# Patient Record
Sex: Female | Born: 1990 | Hispanic: Yes | Marital: Single | Smoking: Never smoker
Health system: Southern US, Community
[De-identification: ages and names within clinical notes are randomized; demographics above are authoritative.]

## PROBLEM LIST (undated history)

## (undated) ENCOUNTER — Inpatient Hospital Stay (HOSPITAL_COMMUNITY): Payer: Self-pay

## (undated) DIAGNOSIS — D649 Anemia, unspecified: Secondary | ICD-10-CM

## (undated) HISTORY — PX: NO PAST SURGERIES: SHX2092

---

## 2011-03-27 NOTE — L&D Delivery Note (Signed)
Delivery Note At 8:58 PM a healthy female was delivered via  (Presentation vertex, ROA ).  APGAR 8,9: weight pending .   Placenta status: spontaneous delivery intact  Anesthesia:  none Episiotomy: none Lacerations: small first degree Suture Repair: 3.0 vicryl rapide Est. Blood Loss (mL): 400cc  Mom to postpartum.  Baby to stay with mom.  Nancy Fisher 01/02/2012, 9:17 PM

## 2011-09-19 ENCOUNTER — Other Ambulatory Visit (HOSPITAL_COMMUNITY): Payer: Self-pay | Admitting: Obstetrics and Gynecology

## 2011-09-19 DIAGNOSIS — Z3689 Encounter for other specified antenatal screening: Secondary | ICD-10-CM

## 2011-09-24 ENCOUNTER — Ambulatory Visit (HOSPITAL_COMMUNITY)
Admission: RE | Admit: 2011-09-24 | Discharge: 2011-09-24 | Disposition: A | Discharge status: Home / Self Care | Payer: Medicaid Other | Source: Ambulatory Visit | Attending: Obstetrics and Gynecology | Admitting: Obstetrics and Gynecology

## 2011-09-24 DIAGNOSIS — Z363 Encounter for antenatal screening for malformations: Secondary | ICD-10-CM | POA: Insufficient documentation

## 2011-09-24 DIAGNOSIS — Z3689 Encounter for other specified antenatal screening: Secondary | ICD-10-CM

## 2011-09-24 DIAGNOSIS — O358XX0 Maternal care for other (suspected) fetal abnormality and damage, not applicable or unspecified: Secondary | ICD-10-CM | POA: Insufficient documentation

## 2011-09-24 DIAGNOSIS — Z1389 Encounter for screening for other disorder: Secondary | ICD-10-CM | POA: Insufficient documentation

## 2011-09-24 DIAGNOSIS — O0930 Supervision of pregnancy with insufficient antenatal care, unspecified trimester: Secondary | ICD-10-CM | POA: Insufficient documentation

## 2011-09-28 LAB — OB RESULTS CONSOLE ABO/RH: RH Type: POSITIVE

## 2011-12-16 ENCOUNTER — Inpatient Hospital Stay (HOSPITAL_COMMUNITY)
Admission: AD | Admit: 2011-12-16 | Discharge: 2011-12-16 | Disposition: A | Discharge status: Home / Self Care | Payer: Medicaid Other | Source: Ambulatory Visit | Attending: Obstetrics and Gynecology | Admitting: Obstetrics and Gynecology

## 2011-12-16 ENCOUNTER — Encounter (HOSPITAL_COMMUNITY): Payer: Self-pay

## 2011-12-16 DIAGNOSIS — O479 False labor, unspecified: Secondary | ICD-10-CM

## 2011-12-16 DIAGNOSIS — O99891 Other specified diseases and conditions complicating pregnancy: Secondary | ICD-10-CM | POA: Insufficient documentation

## 2011-12-16 LAB — POCT FERN TEST

## 2011-12-16 LAB — WET PREP, GENITAL

## 2011-12-16 NOTE — Progress Notes (Signed)
Dr Jackelyn Knife notified of patient complaints of possible leaking amniotic fluids since 0130am, tracing, ctx pattern, sve result, wet prep result and negative fern and pooling per marie williams cnm speculum exam. Order to discharge patient home and call into her choice pharmacy flagyl 500mg  po bid for 7 days, no refills.

## 2011-12-16 NOTE — Progress Notes (Signed)
FHT from early this am reviewed.  Reactive NST, irreg ctx.

## 2011-12-16 NOTE — MAU Note (Signed)
Patient is in with c/o of possible leaking fluids since 0130am. She states that her underpants felt wet. Reports good fetal movement. Denies pain.

## 2011-12-16 NOTE — MAU Provider Note (Signed)
  History     CSN: [id number]  Arrival date and time: 12/16/11 0231   First Provider Initiated Contact with Patient 12/16/11 [phone]      Chief Complaint  Patient presents with  . Rupture of Membranes   HPI Asked to rule out rupture on this patient who denies contractions or bleeding.   OB History    Grav Para Term Preterm Abortions TAB SAB Ect Mult Living   2         1      History reviewed. No pertinent past medical history.  History reviewed. No pertinent past surgical history.  History reviewed. No pertinent family history.  History  Substance Use Topics  . Smoking status: Never Smoker   . Smokeless tobacco: Not on file  . Alcohol Use: No    Allergies: No Known Allergies  No prescriptions prior to admission    ROS See HPI  Physical Exam   Blood pressure 142/84, pulse 88, temperature 98.5 F (36.9 C), temperature source Oral, resp. rate 18, height 5\' 2"  (1.575 m), weight 290 lb 3.2 oz (131.634 kg).  Physical Exam  Constitutional: She is oriented to person, place, and time. She appears well-developed and well-nourished. No distress.  Cardiovascular: Normal rate.   Respiratory: Effort normal.  GI: Soft. She exhibits no distension. There is no tenderness.  Genitourinary: Uterus normal. Vaginal discharge (thin white, no pooing, no ferning) found.  Musculoskeletal: Normal range of motion.  Neurological: She is alert and oriented to person, place, and time.  Skin: Skin is warm and dry.  Psychiatric: She has a normal mood and affect.   Dilation: 1 Effacement (%): 50 Cervical Position: Posterior Station: -3 Presentation: Vertex Exam by:: Peace, rn  MAU Course  Procedures  Assessment and Plan  A:  SIUP at [redacted]w[redacted]d       No evidence of ruptured membranes P:  RN will call Doctor Meisinger  [facility] 12/16/2011, 3:27 AM

## 2012-01-02 ENCOUNTER — Encounter (HOSPITAL_COMMUNITY): Payer: Self-pay | Admitting: *Deleted

## 2012-01-02 ENCOUNTER — Inpatient Hospital Stay (HOSPITAL_COMMUNITY)
Admission: AD | Admit: 2012-01-02 | Discharge: 2012-01-02 | Disposition: A | Discharge status: Home / Self Care | Payer: Medicaid Other | Source: Ambulatory Visit | Attending: Obstetrics and Gynecology | Admitting: Obstetrics and Gynecology

## 2012-01-02 ENCOUNTER — Inpatient Hospital Stay (HOSPITAL_COMMUNITY)
Admission: AD | Admit: 2012-01-02 | Discharge: 2012-01-04 | DRG: 775 | Disposition: A | Discharge status: Home / Self Care | Payer: Medicaid Other | Source: Ambulatory Visit | Attending: Obstetrics and Gynecology | Admitting: Obstetrics and Gynecology

## 2012-01-02 ENCOUNTER — Encounter (HOSPITAL_COMMUNITY): Payer: Self-pay

## 2012-01-02 DIAGNOSIS — Z22330 Carrier of Group B streptococcus: Secondary | ICD-10-CM

## 2012-01-02 DIAGNOSIS — O479 False labor, unspecified: Secondary | ICD-10-CM | POA: Insufficient documentation

## 2012-01-02 DIAGNOSIS — O99892 Other specified diseases and conditions complicating childbirth: Secondary | ICD-10-CM | POA: Diagnosis present

## 2012-01-02 HISTORY — DX: Anemia, unspecified: D64.9

## 2012-01-02 LAB — COMPREHENSIVE METABOLIC PANEL
BUN: 9 mg/dL (ref 6–23)
Calcium: 8.6 mg/dL (ref 8.4–10.5)
GFR calc Af Amer: >90 mL/min (ref >90)
Glucose, Bld: 96 mg/dL (ref 70–99)
Sodium: 133 mEq/L — ABNORMAL LOW (ref 135–145)
Total Protein: 6.1 g/dL (ref 6.0–8.3)

## 2012-01-02 LAB — CBC
HCT: 32.3 % — ABNORMAL LOW (ref 36.0–46.0)
Hemoglobin: 10.5 g/dL — ABNORMAL LOW (ref 12.0–15.0)
MCH: 27.5 pg (ref 26.0–34.0)
MCHC: 32.5 g/dL (ref 30.0–36.0)

## 2012-01-02 LAB — URINE MICROSCOPIC-ADD ON

## 2012-01-02 LAB — URINALYSIS, ROUTINE W REFLEX MICROSCOPIC
Glucose, UA: NEGATIVE mg/dL
Ketones, ur: NEGATIVE mg/dL
Leukocytes, UA: NEGATIVE
Nitrite: NEGATIVE
Protein, ur: NEGATIVE mg/dL
Urobilinogen, UA: 0.2 mg/dL (ref 0.0–1.0)

## 2012-01-02 MED ORDER — MEASLES, MUMPS & RUBELLA VAC ~~LOC~~ INJ
0.5000 mL | INJECTION | Freq: Once | SUBCUTANEOUS | Status: AC
  Administered 2012-01-04: 0.5 mL via SUBCUTANEOUS
  Filled 2012-01-02: qty 0.5

## 2012-01-02 MED ORDER — OXYCODONE-ACETAMINOPHEN 5-325 MG PO TABS
1.0000 | ORAL_TABLET | ORAL | Status: DC | PRN
  Administered 2012-01-03 – 2012-01-04 (×4): 1 via ORAL
  Filled 2012-01-02 (×4): qty 1

## 2012-01-02 MED ORDER — OXYTOCIN 40 UNITS IN LACTATED RINGERS INFUSION - SIMPLE MED
62.5000 mL/h | Freq: Once | INTRAVENOUS | Status: DC
  Filled 2012-01-02: qty 1000

## 2012-01-02 MED ORDER — IBUPROFEN 600 MG PO TABS
600.0000 mg | ORAL_TABLET | Freq: Four times a day (QID) | ORAL | Status: DC
  Administered 2012-01-03 – 2012-01-04 (×8): 600 mg via ORAL
  Filled 2012-01-02 (×8): qty 1

## 2012-01-02 MED ORDER — OXYCODONE-ACETAMINOPHEN 5-325 MG PO TABS: 1.0000 | ORAL_TABLET | ORAL | Status: DC | PRN

## 2012-01-02 MED ORDER — ONDANSETRON HCL 4 MG/2ML IJ SOLN: 4.0000 mg | Freq: Four times a day (QID) | INTRAMUSCULAR | Status: DC | PRN

## 2012-01-02 MED ORDER — CITRIC ACID-SODIUM CITRATE 334-500 MG/5ML PO SOLN: 30.0000 mL | ORAL | Status: DC | PRN

## 2012-01-02 MED ORDER — SODIUM CHLORIDE 0.9 % IV SOLN
2.0000 g | Freq: Once | INTRAVENOUS | Status: AC
  Administered 2012-01-02: 2 g via INTRAVENOUS
  Filled 2012-01-02: qty 2000

## 2012-01-02 MED ORDER — WITCH HAZEL-GLYCERIN EX PADS: 1.0000 "application " | MEDICATED_PAD | CUTANEOUS | Status: DC | PRN

## 2012-01-02 MED ORDER — BUTORPHANOL TARTRATE 1 MG/ML IJ SOLN
1.0000 mg | INTRAMUSCULAR | Status: DC | PRN
  Administered 2012-01-02: 1 mg via INTRAVENOUS
  Filled 2012-01-02: qty 1

## 2012-01-02 MED ORDER — DIPHENHYDRAMINE HCL 50 MG/ML IJ SOLN: 12.5000 mg | INTRAMUSCULAR | Status: DC | PRN

## 2012-01-02 MED ORDER — ACETAMINOPHEN 325 MG PO TABS: 650.0000 mg | ORAL_TABLET | ORAL | Status: DC | PRN

## 2012-01-02 MED ORDER — LACTATED RINGERS IV SOLN: 500.0000 mL | Freq: Once | INTRAVENOUS | Status: DC

## 2012-01-02 MED ORDER — DIBUCAINE 1 % RE OINT: 1.0000 "application " | TOPICAL_OINTMENT | RECTAL | Status: DC | PRN

## 2012-01-02 MED ORDER — EPHEDRINE 5 MG/ML INJ: 10.0000 mg | INTRAVENOUS | Status: DC | PRN

## 2012-01-02 MED ORDER — BENZOCAINE-MENTHOL 20-0.5 % EX AERO
1.0000 "application " | INHALATION_SPRAY | CUTANEOUS | Status: DC | PRN
  Administered 2012-01-03: 1 via TOPICAL
  Filled 2012-01-02: qty 56

## 2012-01-02 MED ORDER — LACTATED RINGERS IV SOLN: 500.0000 mL | INTRAVENOUS | Status: DC | PRN

## 2012-01-02 MED ORDER — ONDANSETRON HCL 4 MG/2ML IJ SOLN: 4.0000 mg | INTRAMUSCULAR | Status: DC | PRN

## 2012-01-02 MED ORDER — PHENYLEPHRINE 40 MCG/ML (10ML) SYRINGE FOR IV PUSH (FOR BLOOD PRESSURE SUPPORT): 80.0000 ug | PREFILLED_SYRINGE | INTRAVENOUS | Status: DC | PRN

## 2012-01-02 MED ORDER — IBUPROFEN 600 MG PO TABS: 600.0000 mg | ORAL_TABLET | Freq: Four times a day (QID) | ORAL | Status: DC | PRN

## 2012-01-02 MED ORDER — ZOLPIDEM TARTRATE 5 MG PO TABS: 5.0000 mg | ORAL_TABLET | Freq: Every evening | ORAL | Status: DC | PRN

## 2012-01-02 MED ORDER — OXYTOCIN 40 UNITS IN LACTATED RINGERS INFUSION - SIMPLE MED
1.0000 m[IU]/min | INTRAVENOUS | Status: DC
  Administered 2012-01-02: 2 m[IU]/min via INTRAVENOUS

## 2012-01-02 MED ORDER — LACTATED RINGERS IV SOLN
INTRAVENOUS | Status: DC
  Administered 2012-01-02: 19:00:00 via INTRAVENOUS

## 2012-01-02 MED ORDER — FENTANYL 2.5 MCG/ML BUPIVACAINE 1/10 % EPIDURAL INFUSION (WH - ANES): 14.0000 mL/h | INTRAMUSCULAR | Status: DC

## 2012-01-02 MED ORDER — LIDOCAINE HCL (PF) 1 % IJ SOLN
30.0000 mL | INTRAMUSCULAR | Status: DC | PRN
  Administered 2012-01-02: 30 mL via SUBCUTANEOUS
  Filled 2012-01-02: qty 30

## 2012-01-02 MED ORDER — TETANUS-DIPHTH-ACELL PERTUSSIS 5-2.5-18.5 LF-MCG/0.5 IM SUSP: 0.5000 mL | Freq: Once | INTRAMUSCULAR | Status: DC

## 2012-01-02 MED ORDER — SIMETHICONE 80 MG PO CHEW: 80.0000 mg | CHEWABLE_TABLET | ORAL | Status: DC | PRN

## 2012-01-02 MED ORDER — LANOLIN HYDROUS EX OINT: TOPICAL_OINTMENT | CUTANEOUS | Status: DC | PRN

## 2012-01-02 MED ORDER — OXYTOCIN BOLUS FROM INFUSION
500.0000 mL | Freq: Once | INTRAVENOUS | Status: AC
  Administered 2012-01-02: 500 mL via INTRAVENOUS
  Filled 2012-01-02: qty 500

## 2012-01-02 MED ORDER — DIPHENHYDRAMINE HCL 25 MG PO CAPS: 25.0000 mg | ORAL_CAPSULE | Freq: Four times a day (QID) | ORAL | Status: DC | PRN

## 2012-01-02 MED ORDER — ONDANSETRON HCL 4 MG PO TABS: 4.0000 mg | ORAL_TABLET | ORAL | Status: DC | PRN

## 2012-01-02 MED ORDER — TERBUTALINE SULFATE 1 MG/ML IJ SOLN: 0.2500 mg | Freq: Once | INTRAMUSCULAR | Status: DC | PRN

## 2012-01-02 MED ORDER — SENNOSIDES-DOCUSATE SODIUM 8.6-50 MG PO TABS
2.0000 | ORAL_TABLET | Freq: Every day | ORAL | Status: DC
  Administered 2012-01-03: 2 via ORAL

## 2012-01-02 MED ORDER — PRENATAL MULTIVITAMIN CH
1.0000 | ORAL_TABLET | Freq: Every day | ORAL | Status: DC
  Administered 2012-01-03 – 2012-01-04 (×2): 1 via ORAL
  Filled 2012-01-02 (×2): qty 1

## 2012-01-02 NOTE — MAU Note (Signed)
C/o ucs since 0325 this Am;

## 2012-01-02 NOTE — MAU Note (Signed)
Dr. Senaida Ores called to check on pt, notified of pt's cervical exam, unchanged from yesterday, efm tracing reactive. Orders to d/c home with labor precautions.

## 2012-01-02 NOTE — H&P (Signed)
Nancy Fisher is a 21 y.o. female G2P1001 at 39+weeks (EDD 01/06/12 by 25 week [place]) presenting for painful contractions and cervical change to 5cm.  Prenatal care complicated by late start at 25 weeks.  SHe is rubella equivocal and GBS positive. No other issues.  Maternal Medical History:  Reason for admission: Reason for admission: contractions.  Contractions: Onset was 6-12 hours ago.   Frequency: regular.   Perceived severity is moderate.    Fetal activity: Perceived fetal activity is normal.    Prenatal complications: No hypertension.     OB History    Grav Para Term Preterm Abortions TAB SAB Ect Mult Living   2 1 1       1     2011 NSVD 7-8lbs  Past Medical History  Diagnosis Date  . Anemia    Past Surgical History  Procedure Date  . No past surgeries    Family History: family history is negative for Other, and Alcohol abuse, and Arthritis, and Asthma, and Birth defects, and Cancer, and COPD, and Depression, and Diabetes, and Drug abuse, and Early death, and Hearing loss, and Heart disease, and Hyperlipidemia, and Hypertension, and Kidney disease, and Learning disabilities, and Mental illness, and Mental retardation, and Miscarriages / Stillbirths, and Stroke, and Vision loss, . Social History:  reports that she has never smoked. She has never used smokeless tobacco. She reports that she does not drink alcohol or use illicit drugs.   Prenatal Transfer Tool  Maternal Diabetes: No Genetic Screening: too late to care Maternal Ultrasounds/Referrals: Normal Fetal Ultrasounds or other Referrals:  None Maternal Substance Abuse:  No Significant Maternal Medications:  None Significant Maternal Lab Results:  Lab values include: Group B Strep positive Other Comments:  None  Review of Systems  Neurological: Negative for headaches.    Dilation: 5 Effacement (%): 90 Station: -2 Exam by:: Ameliarose Shark AROM clear Blood pressure 147/100, pulse 85, temperature 99.1 F (37.3 C),  temperature source Oral, resp. rate 20, height 5' (1.524 m), weight 131.543 kg (290 lb). Maternal Exam:  Uterine Assessment: Contraction strength is moderate.  Contraction frequency is regular.   Abdomen: Patient reports no abdominal tenderness. Fetal presentation: vertex  Introitus: Normal vulva. Normal vagina.    Physical Exam  Constitutional: She is oriented to person, place, and time. She appears well-developed and well-nourished.  Cardiovascular: Normal rate and regular rhythm.   Respiratory: Effort normal and breath sounds normal.  GI: Soft. Bowel sounds are normal.  Genitourinary: Vagina normal and uterus normal.  Neurological: She is alert and oriented to person, place, and time.  Psychiatric: She has a normal mood and affect. Her behavior is normal.    Prenatal labs: ABO, Rh:  O positive Antibody:  negative Rubella: Equivocal (05/22 0000) RPR:   negative HBsAg:   negative HIV:   negative GBS: Positive (09/13 0000)  One hour GCT 93 Assessment/Plan: Pt with some elevated BP but very uncomfortable with contractions.  Will check CBC and CMET.  Urine negative for proteinuria earlier today.   Ampicillin already on board for +GBS.  Pt requests IV pain meds, declines epidural.  Huel Cote W 01/02/2012, 7:56 PM

## 2012-01-02 NOTE — MAU Note (Signed)
Pt states here for labor eval, ctx's q9 minutes apart. Denies lof, notes blood tinged mucus intermittently.

## 2012-01-03 LAB — CBC
HCT: 29.3 % — ABNORMAL LOW (ref 36.0–46.0)
Hemoglobin: 9.4 g/dL — ABNORMAL LOW (ref 12.0–15.0)
MCH: 27.2 pg (ref 26.0–34.0)
MCHC: 32.1 g/dL (ref 30.0–36.0)
MCV: 84.7 fL (ref 78.0–100.0)

## 2012-01-03 LAB — TYPE AND SCREEN
ABO/RH(D): O POS
DAT, IgG: NEGATIVE

## 2012-01-03 NOTE — Progress Notes (Signed)
Post Partum Day 1 Subjective: no complaints and tolerating PO  Objective: Blood pressure 123/88, pulse 90, temperature 98.5 F (36.9 C), temperature source Oral, resp. rate 18, height 5' (1.524 m), weight 131.543 kg (290 lb), unknown if currently breastfeeding.  Physical Exam:  General: alert and cooperative Lochia: appropriate Uterine Fundus: firm   Basename 01/03/12 0515 01/02/12 1955  HGB 9.4* 10.5*  HCT 29.3* 32.3*    Assessment/Plan: Plan for discharge tomorrow   LOS: 1 day   [name] W 01/03/2012, 8:59 AM

## 2012-01-03 NOTE — Plan of Care (Signed)
Problem: Discharge Progression Outcomes Goal: MMR given as ordered Outcome: Not Met (add Reason) MMR prior to d/c

## 2012-01-03 NOTE — Progress Notes (Signed)
UR chart review completed.  

## 2012-01-04 MED ORDER — OXYCODONE-ACETAMINOPHEN 5-325 MG PO TABS: 1.0000 | ORAL_TABLET | ORAL | Status: DC | PRN

## 2012-01-04 MED ORDER — IBUPROFEN 600 MG PO TABS: 600.0000 mg | ORAL_TABLET | Freq: Four times a day (QID) | ORAL | Status: DC

## 2012-01-04 NOTE — Progress Notes (Signed)
Post Partum Day 2  Subjective: no complaints, up ad lib and tolerating PO  Objective: Blood pressure 120/81, pulse 74, temperature 97.4 F (36.3 C), temperature source Oral, resp. rate 18, height 5' (1.524 m), weight 131.543 kg (290 lb), unknown if currently breastfeeding.  Physical Exam:  General: alert and cooperative Lochia: normal Uterine Fundus: firm    Basename 01/03/12 0515 01/02/12 1955  HGB 9.4* 10.5*  HCT 29.3* 32.3*    Assessment/Plan: Discharge home Motrin and percocet   LOS: 2 days   Nancy Fisher W 01/04/2012, 7:53 AM

## 2012-01-04 NOTE — Discharge Summary (Signed)
Obstetric Discharge Summary Reason for Admission: onset of labor Prenatal Procedures: none Intrapartum Procedures: spontaneous vaginal delivery Postpartum Procedures: none Complications-Operative and Postpartum: first degree perineal laceration Hemoglobin  Date Value Range Status  01/03/2012 9.4* 12.0 - 15.0 g/dL Final     HCT  Date Value Range Status  01/03/2012 29.3* 36.0 - 46.0 % Final    Physical Exam:  General: alert and cooperative Lochia: appropriate Uterine Fundus: firm   Discharge Diagnoses: Term Pregnancy-delivered  Discharge Information: Date: 01/04/2012 Activity: pelvic rest Diet: routine Medications: Ibuprofen and Percocet Condition: stable Instructions: refer to practice specific booklet Discharge to: home Follow-up Information    Follow up with Oliver Pila, MD. Schedule an appointment as soon as possible for a visit in 6 weeks.   Contact information:   [address], [address] [place] [address] [phone]          Newborn Data: Live born female  Birth Weight: 6 lb 12.2 oz (3067 g) APGAR: 8, 9  Home with mother.  Oliver Pila 01/04/2012, 7:56 AM

## 2014-01-25 ENCOUNTER — Encounter (HOSPITAL_COMMUNITY): Payer: Self-pay | Admitting: *Deleted

## 2015-11-30 ENCOUNTER — Emergency Department (HOSPITAL_COMMUNITY)
Admission: EM | Admit: 2015-11-30 | Discharge: 2015-11-30 | Disposition: A | Discharge status: Home / Self Care | Payer: Medicaid Other | Attending: Emergency Medicine | Admitting: Emergency Medicine

## 2015-11-30 ENCOUNTER — Encounter (HOSPITAL_COMMUNITY): Payer: Self-pay | Admitting: Emergency Medicine

## 2015-11-30 ENCOUNTER — Emergency Department (HOSPITAL_COMMUNITY): Payer: Medicaid Other

## 2015-11-30 DIAGNOSIS — R0789 Other chest pain: Secondary | ICD-10-CM

## 2015-11-30 LAB — BASIC METABOLIC PANEL
Anion gap: 14 (ref 5–15)
BUN: 16 mg/dL (ref 6–20)
CO2: 24 mmol/L (ref 22–32)
CREATININE: 0.74 mg/dL (ref 0.44–1.00)
Calcium: 8.9 mg/dL (ref 8.9–10.3)
Chloride: 102 mmol/L (ref 101–111)
Glucose, Bld: 102 mg/dL — ABNORMAL HIGH (ref 65–99)
POTASSIUM: 4.1 mmol/L (ref 3.5–5.1)
SODIUM: 140 mmol/L (ref 135–145)

## 2015-11-30 LAB — CBC
HCT: 37.5 % (ref 36.0–46.0)
Hemoglobin: 12.2 g/dL (ref 12.0–15.0)
MCH: 28.2 pg (ref 26.0–34.0)
MCHC: 32.5 g/dL (ref 30.0–36.0)
MCV: 86.6 fL (ref 78.0–100.0)
PLATELETS: 295 10*3/uL (ref 150–400)
RBC: 4.33 MIL/uL (ref 3.87–5.11)
RDW: 13.8 % (ref 11.5–15.5)
WBC: 12.1 10*3/uL — AB (ref 4.0–10.5)

## 2015-11-30 LAB — I-STAT BETA HCG BLOOD, ED (MC, WL, AP ONLY)

## 2015-11-30 LAB — I-STAT TROPONIN, ED
Troponin i, poc: 0.05 ng/mL (ref 0.00–0.08)
Troponin i, poc: 0.02 ng/mL (ref 0.00–0.08)

## 2015-11-30 MED ORDER — IBUPROFEN 800 MG PO TABS
800.0000 mg | ORAL_TABLET | Freq: Once | ORAL | Status: AC
  Administered 2015-11-30: 800 mg via ORAL
  Filled 2015-11-30: qty 1

## 2015-11-30 NOTE — ED Provider Notes (Signed)
TIME SEEN: 4:40 AM  CHIEF COMPLAINT: Chest pain  HPI: Pt is a 25 y.o. female with history of anemia who presents to the emergency department with complaints of chest pain shortness of breath. States she has chest pain throughout her entire chest that feels "like contractions" and states it "pokes me everywhere". Denies that her chest pain is worse with exertion. He states it is worse with lying flat. She does have shortness of breath for the past several days with exertion and has not been able to exercise because of this. No fevers or cough. No nausea, vomiting or diarrhea. No pain or swelling in her legs. No dysuria, hematuria, vaginal bleeding or discharge. States the pain lasts for several seconds and then resolves completely. She also feels like she is having pain in her abdomen as well. No family history of premature CAD. No hypertension, diabetes, hyperlipidemia. She is not a smoker.  No history of PE, DVT, exogenous estrogen use, fracture, surgery, trauma, hospitalization, prolonged travel. No lower extremity swelling or pain. No calf tenderness.   ROS: See HPI Constitutional: no fever  Eyes: no drainage  ENT: no runny nose   Cardiovascular:   chest pain  Resp:  SOB  GI: no vomiting GU: no dysuria Integumentary: no rash  Allergy: no hives  Musculoskeletal: no leg swelling  Neurological: no slurred speech ROS otherwise negative  PAST MEDICAL HISTORY/PAST SURGICAL HISTORY:  Past Medical History:  Diagnosis Date  . Anemia     MEDICATIONS:  Prior to Admission medications   Medication Sig Start Date End Date Taking? Authorizing Provider  ibuprofen (ADVIL,MOTRIN) 600 MG tablet Take 1 tablet (600 mg total) by mouth every 6 (six) hours. 01/04/12   Huel CoteKathy Richardson, MD  oxyCODONE-acetaminophen (PERCOCET/ROXICET) 5-325 MG per tablet Take 1-2 tablets by mouth every 3 (three) hours as needed (moderate - severe pain). 01/04/12   Huel CoteKathy Richardson, MD  Prenatal Vit-Fe Fumarate-FA (PRENATAL  MULTIVITAMIN) TABS Take 1 tablet by mouth daily.    Historical Provider, MD    ALLERGIES:  No Known Allergies  SOCIAL HISTORY:  Social History  Substance Use Topics  . Smoking status: Never Smoker  . Smokeless tobacco: Never Used  . Alcohol use No    FAMILY HISTORY: Family History  Problem Relation Age of Onset  . Other Neg Hx   . Alcohol abuse Neg Hx   . Arthritis Neg Hx   . Asthma Neg Hx   . Birth defects Neg Hx   . Cancer Neg Hx   . COPD Neg Hx   . Depression Neg Hx   . Diabetes Neg Hx   . Drug abuse Neg Hx   . Early death Neg Hx   . Hearing loss Neg Hx   . Heart disease Neg Hx   . Hyperlipidemia Neg Hx   . Hypertension Neg Hx   . Kidney disease Neg Hx   . Learning disabilities Neg Hx   . Mental illness Neg Hx   . Mental retardation Neg Hx   . Miscarriages / Stillbirths Neg Hx   . Stroke Neg Hx   . Vision loss Neg Hx     EXAM: BP 112/78   Pulse 68   Temp 98.4 F (36.9 C) (Oral)   Resp 17   Ht 5\' 3"  (1.6 m)   Wt 150 lb (68 kg)   LMP 11/16/2015 (Approximate)   SpO2 100%   BMI 26.57 kg/m  CONSTITUTIONAL: Alert and oriented and responds appropriately to questions. Well-appearing; well-nourished, appears  anxious HEAD: Normocephalic EYES: Conjunctivae clear, PERRL ENT: normal nose; no rhinorrhea; moist mucous membranes NECK: Supple, no meningismus, no LAD  CARD: RRR; S1 and S2 appreciated; no murmurs, no clicks, no rubs, no gallops RESP: Normal chest excursion without splinting or tachypnea; breath sounds clear and equal bilaterally; no wheezes, no rhonchi, no rales, no hypoxia or respiratory distress, speaking full sentences ABD/GI: Normal bowel sounds; non-distended; soft, non-tender, no rebound, no guarding, no peritoneal signs BACK:  The back appears normal and is non-tender to palpation, there is no CVA tenderness EXT: Normal ROM in all joints; non-tender to palpation; no edema; normal capillary refill; no cyanosis, no calf tenderness or swelling     SKIN: Normal color for age and race; warm; no rash NEURO: Moves all extremities equally, sensation to light touch intact diffusely, cranial nerves II through XII intact PSYCH: The patient's mood and manner are appropriate. Grooming and personal hygiene are appropriate.  MEDICAL DECISION MAKING: Patient here with atypical chest pain. No risk factors for PE. Doubt dissection. Doubt ACS. Troponin negative. Chest x-ray clear. EKG shows no ischemic changes, arrhythmia or interval abnormality.  Pregnancy test is negative. I feel she is safe to be discharged home and follow-up with her outpatient primary care provider.  At this time, I do not feel there is any life-threatening condition present. I have reviewed and discussed all results (EKG, imaging, lab, urine as appropriate), exam findings with patient/family. I have reviewed nursing notes and appropriate previous records.  I feel the patient is safe to be discharged home without further emergent workup and can continue workup as an outpatient as needed. Discussed usual and customary return precautions. Patient/family verbalize understanding and are comfortable with this plan.  Outpatient follow-up has been provided. All questions have been answered.        EKG Interpretation  Date/Time:  Wednesday November 30 2015 01:18:41 EDT Ventricular Rate:  77 PR Interval:  152 QRS Duration: 90 QT Interval:  368 QTC Calculation: 416 R Axis:   86 Text Interpretation:  Normal sinus rhythm Normal ECG No old tracing to compare Confirmed by Correen Bubolz,  DO, Aryana Wonnacott [phone]) on 11/30/2015 4:28:39 AM         Layla Maw Mackinley Cassaday, DO 11/30/15 1914

## 2015-11-30 NOTE — Discharge Instructions (Signed)
To find a primary care or specialty doctor please call [phone] or [phone] to access "[place] [facility] Find a Doctor Service." ° °You may also go on the [facility] website at [email or web address] ° °There are also multiple [facility],  and [facility] practices throughout the [place] that are frequently accepting new patients. You may find a clinic that is close to your home and contact them. ° °[facility] -  °[address] °[place] [address] °[phone] ° °[facility] in [place] (also locations in [place] and [place]) -  °[address] °[place] [address] °[phone] ° °[facility] -  °[address] ° [place] [address] [phone] ° ° °

## 2015-11-30 NOTE — ED Triage Notes (Signed)
Pt. reports left chest pain radiating to left lateral ribs and upper abdomen with SOB and nausea onset last week , pain worsens with exertion or exercising .

## 2015-11-30 NOTE — ED Notes (Signed)
Patient left at this time with all belongings. 

## 2015-11-30 NOTE — ED Notes (Signed)
MD at bedside. 

## 2016-07-21 DIAGNOSIS — G43109 Migraine with aura, not intractable, without status migrainosus: Secondary | ICD-10-CM | POA: Insufficient documentation

## 2017-12-16 DIAGNOSIS — B001 Herpesviral vesicular dermatitis: Secondary | ICD-10-CM | POA: Insufficient documentation

## 2018-03-26 NOTE — L&D Delivery Note (Addendum)
Patient: Nancy Fisher MRN: [id number]  GBS status: Negative   Patient is a 28 y.o. now G3P2002 s/p NSVD at [redacted]w[redacted]d, who was admitted for IOL due to vaginal bleeding. AROM 0h 58m prior to delivery with scant clear fluid.    Delivery Note At 8:08 AM a viable female was delivered via  (Presentation: right OA).  APGAR: 8,9.  weight pending.   Placenta status: Spontaneous, intact.  Cord: 3 vessel, intact with the following complications: loose nuchal cord x1.   Anesthesia: none, lidocaine used for repair.    Episiotomy: None  Lacerations: 1st degree   Suture Repair: 3.0 vicryl Est. Blood Loss (mL): ~550  Head delivered right OA. Loose nuchal cord x1 present. Shoulder and body delivered in usual fashion. Infant with spontaneous cry, placed on mother's abdomen, dried and bulb suctioned. Cord clamped x 2 after 1-minute delay, and cut by family member. Cord blood drawn. Placenta delivered spontaneously with gentle cord traction. Fundus firm with massage and Pitocin. However with continued vaginal bleeding, given cytotec 800mg  rectally. Resolved with additional expulsion of uterine clots. Perineum inspected and found to have a 1st degree laceration, which was repaired by Maryelizabeth Kaufmann, CNM with 3.0 vicryl with good hemostasis achieved.  Mom to postpartum.  Baby to Couplet care / Skin to Skin.  Patriciaann Clan 10/07/2018, 8:31 AM   I was gloved and present for entire delivery SVD without incident No difficulty with shoulders Laceration repair performed by me  Clinic messaged 10/07/18 to coordinate postpartum care  Mallie Snooks, CNM 10/07/18 12:14 PM

## 2018-04-17 LAB — OB RESULTS CONSOLE HEPATITIS B SURFACE ANTIGEN: Hepatitis B Surface Ag: NEGATIVE

## 2018-04-17 LAB — OB RESULTS CONSOLE RUBELLA ANTIBODY, IGM: Rubella: NON-IMMUNE/NOT IMMUNE

## 2018-04-17 LAB — OB RESULTS CONSOLE RPR: RPR: NONREACTIVE

## 2018-04-17 LAB — OB RESULTS CONSOLE HIV ANTIBODY (ROUTINE TESTING): HIV: NONREACTIVE

## 2018-04-17 LAB — OB RESULTS CONSOLE GC/CHLAMYDIA
Chlamydia: NEGATIVE
Gonorrhea: NEGATIVE

## 2018-09-18 LAB — OB RESULTS CONSOLE GBS: GBS: NEGATIVE

## 2018-10-06 ENCOUNTER — Inpatient Hospital Stay (HOSPITAL_COMMUNITY)
Admission: AD | Admit: 2018-10-06 | Discharge: 2018-10-08 | DRG: 807 | Disposition: A | Discharge status: Home / Self Care | Payer: Medicaid Other | Attending: Obstetrics & Gynecology | Admitting: Obstetrics & Gynecology

## 2018-10-06 ENCOUNTER — Other Ambulatory Visit: Payer: Self-pay

## 2018-10-06 ENCOUNTER — Encounter (HOSPITAL_COMMUNITY): Payer: Self-pay | Admitting: *Deleted

## 2018-10-06 DIAGNOSIS — O4693 Antepartum hemorrhage, unspecified, third trimester: Secondary | ICD-10-CM | POA: Diagnosis present

## 2018-10-06 DIAGNOSIS — Z3A39 39 weeks gestation of pregnancy: Secondary | ICD-10-CM

## 2018-10-06 DIAGNOSIS — O26893 Other specified pregnancy related conditions, third trimester: Secondary | ICD-10-CM | POA: Diagnosis present

## 2018-10-06 DIAGNOSIS — Z1159 Encounter for screening for other viral diseases: Secondary | ICD-10-CM

## 2018-10-06 LAB — CBC
HCT: 32.4 % — ABNORMAL LOW (ref 36.0–46.0)
Hemoglobin: 10.1 g/dL — ABNORMAL LOW (ref 12.0–15.0)
MCH: 26 pg (ref 26.0–34.0)
MCHC: 31.2 g/dL (ref 30.0–36.0)
MCV: 83.3 fL (ref 80.0–100.0)
Platelets: 268 10*3/uL (ref 150–400)
RBC: 3.89 MIL/uL (ref 3.87–5.11)
RDW: 19.3 % — ABNORMAL HIGH (ref 11.5–15.5)
WBC: 8.7 10*3/uL (ref 4.0–10.5)
nRBC: 0.5 % — ABNORMAL HIGH (ref 0.0–0.2)

## 2018-10-06 LAB — TYPE AND SCREEN
ABO/RH(D): O POS
Antibody Screen: NEGATIVE

## 2018-10-06 LAB — SARS CORONAVIRUS 2 BY RT PCR (HOSPITAL ORDER, PERFORMED IN ~~LOC~~ HOSPITAL LAB): SARS Coronavirus 2: NEGATIVE

## 2018-10-06 MED ORDER — LIDOCAINE HCL (PF) 1 % IJ SOLN
30.0000 mL | INTRAMUSCULAR | Status: DC | PRN
  Administered 2018-10-07: 30 mL via SUBCUTANEOUS
  Filled 2018-10-06: qty 30

## 2018-10-06 MED ORDER — LACTATED RINGERS IV SOLN: 500.0000 mL | INTRAVENOUS | Status: DC | PRN

## 2018-10-06 MED ORDER — ACETAMINOPHEN 325 MG PO TABS: 650.0000 mg | ORAL_TABLET | ORAL | Status: DC | PRN

## 2018-10-06 MED ORDER — OXYCODONE-ACETAMINOPHEN 5-325 MG PO TABS: 1.0000 | ORAL_TABLET | ORAL | Status: DC | PRN

## 2018-10-06 MED ORDER — OXYTOCIN 40 UNITS IN NORMAL SALINE INFUSION - SIMPLE MED
2.5000 [IU]/h | INTRAVENOUS | Status: DC
  Filled 2018-10-06: qty 1000

## 2018-10-06 MED ORDER — OXYTOCIN BOLUS FROM INFUSION
500.0000 mL | Freq: Once | INTRAVENOUS | Status: AC
  Administered 2018-10-07: 500 mL via INTRAVENOUS

## 2018-10-06 MED ORDER — ONDANSETRON HCL 4 MG/2ML IJ SOLN: 4.0000 mg | Freq: Four times a day (QID) | INTRAMUSCULAR | Status: DC | PRN

## 2018-10-06 MED ORDER — LACTATED RINGERS IV SOLN
INTRAVENOUS | Status: DC
  Administered 2018-10-06: 13:00:00 via INTRAVENOUS

## 2018-10-06 MED ORDER — MISOPROSTOL 50MCG HALF TABLET
ORAL_TABLET | ORAL | Status: AC
  Filled 2018-10-06: qty 1

## 2018-10-06 MED ORDER — OXYCODONE-ACETAMINOPHEN 5-325 MG PO TABS: 2.0000 | ORAL_TABLET | ORAL | Status: DC | PRN

## 2018-10-06 MED ORDER — SOD CITRATE-CITRIC ACID 500-334 MG/5ML PO SOLN: 30.0000 mL | ORAL | Status: DC | PRN

## 2018-10-06 MED ORDER — MISOPROSTOL 50MCG HALF TABLET
50.0000 ug | ORAL_TABLET | ORAL | Status: DC
  Administered 2018-10-06: 23:00:00 50 ug via BUCCAL

## 2018-10-06 NOTE — H&P (Addendum)
LABOR AND DELIVERY ADMISSION HISTORY AND PHYSICAL NOTE  Prudy FeelerCecilia Caldwell is a 28 y.o. female G3P2002 with IUP at 213w1d by LMP presenting for induction of labor in the setting of vaginal bleeding and mild contractions. Patient reports noticing a blood clot this morning when using the restroom. Bleeding has since subsided. Endorses some mild cramping occasionally. No other concerns at this time. She reports positive fetal movement and denies leakage of fluid.  [facility] Pregnancy complications:  - Rubella non-immune   Past Medical History: Past Medical History:  Diagnosis Date  . Anemia     Past Surgical History: Past Surgical History:  Procedure Laterality Date  . NO PAST SURGERIES      Obstetrical History: OB History    Gravida  3   Para  2   Term  2   Preterm      AB      Living  2     SAB      TAB      Ectopic      Multiple      Live Births  1           Social History: Social History   Socioeconomic History  . Marital status: Single    Spouse name: Not on file  . Number of children: Not on file  . Years of education: Not on file  . Highest education level: Not on file  Occupational History  . Not on file  Social Needs  . Financial resource strain: Not on file  . Food insecurity    Worry: Not on file    Inability: Not on file  . Transportation needs    Medical: Not on file    Non-medical: Not on file  Tobacco Use  . Smoking status: Never Smoker  . Smokeless tobacco: Never Used  Substance and Sexual Activity  . Alcohol use: No  . Drug use: No  . Sexual activity: Not Currently    Birth control/protection: None  Lifestyle  . Physical activity    Days per week: Not on file    Minutes per session: Not on file  . Stress: Not on file  Relationships  . Social Musicianconnections    Talks on phone: Not on file    Gets together: Not on file    Attends religious service: Not on file    Active member of  club or organization: Not on file    Attends meetings of clubs or organizations: Not on file    Relationship status: Not on file  Other Topics Concern  . Not on file  Social History Narrative  . Not on file    Family History: Family History  Problem Relation Age of Onset  . Other Neg Hx   . Alcohol abuse Neg Hx   . Arthritis Neg Hx   . Asthma Neg Hx   . Birth defects Neg Hx   . Cancer Neg Hx   . COPD Neg Hx   . Depression Neg Hx   . Diabetes Neg Hx   . Drug abuse Neg Hx   . Early death Neg Hx   . Hearing loss Neg Hx   . Heart disease Neg Hx   . Hyperlipidemia Neg Hx   . Hypertension Neg Hx   . Kidney disease Neg Hx   . Learning disabilities Neg Hx   . Mental illness Neg Hx   . Mental retardation Neg Hx   . Miscarriages /  Stillbirths Neg Hx   . Stroke Neg Hx   . Vision loss Neg Hx     Allergies: No Known Allergies  Medications Prior to Admission  Medication Sig Dispense Refill Last Dose  . Prenatal Vit-Fe Fumarate-FA (MULTIVITAMIN-PRENATAL) 27-0.8 MG TABS tablet Take 1 tablet by mouth daily at 12 noon.   Past Week at Unknown time   Review of Systems  All systems reviewed and negative except as stated in HPI  Physical Exam Blood pressure 140/89, pulse 75, temperature 98 F (36.7 C), temperature source Oral, resp. rate 20, height 5\' 3"  (1.6 m), weight 97.5 kg, SpO2 99 %, unknown if currently breastfeeding. General appearance: Alert, oriented, NAD Lungs: Normal respiratory effort Heart: Regular rate Abdomen: Soft, non-tender; gravid, FH appropriate for GA Extremities: No calf swelling or tenderness Presentation: Cephalic Fetal monitoring: Baseline HR 145, moderate variability, +accels, -decels Uterine activity: Occasional contractions Dilation: 2 Effacement (%): 80 Station: -3 Exam by:: Lillia Pauls. Albert, MD  Prenatal labs: ABO, Rh: --/--/O POS, O POS Performed at [facility], [address] [place], [place] [address]  [phone](07/13 1308) Antibody: NEG (07/13  1308) Rubella: Nonimmune (01/23 0000) RPR: Nonreactive (01/23 0000)  HBsAg: Negative (01/23 0000)  HIV: Non-reactive (01/23 0000)  GC/Chlamydia: Negative GBS: Negative (06/25 0000)  1-hr GTT: 92 Genetic screening: Declined Quad screen Anatomy US: Normal  Prenatal Transfer Tool  Maternal Diabetes: No Genetic Screening: Declined Maternal Ultrasounds/Referrals: Normal Fetal Ultrasounds or other Referrals:  None Maternal Substance Abuse:  No Significant Maternal Medications:  None Significant Maternal Lab Results: GBS negative  Results for orders placed or performed during the hospital encounter of 10/06/18 (from the past 24 hour(s))  SARS Coronavirus 2 (CEPHEID - Performed in [facility] lab), [facility] Order   Collection Time: 10/06/18 12:57 PM   Specimen: Nasopharyngeal Swab  Result Value Ref Range   SARS Coronavirus 2 NEGATIVE NEGATIVE  Type and screen [facility]   Collection Time: 10/06/18  1:08 PM  Result Value Ref Range   ABO/RH(D) O POS    Antibody Screen NEG    Sample Expiration      10/09/2018,2359 Performed at [facility], [address] [place], [place] [address]   ABO/Rh   Collection Time: 10/06/18  1:08 PM  Result Value Ref Range   ABO/RH(D)      O POS Performed at [facility], [address] [place], [place] [address]   CBC   Collection Time: 10/06/18  1:09 PM  Result Value Ref Range   WBC 8.7 4.0 - 10.5 K/uL   RBC 3.89 3.87 - 5.11 MIL/uL   Hemoglobin 10.1 (L) 12.0 - 15.0 g/dL   HCT 82.932.4 (L) 56.236.0 - 13.046.0 %   MCV 83.3 80.0 - 100.0 fL   MCH 26.0 26.0 - 34.0 pg   MCHC 31.2 30.0 - 36.0 g/dL   RDW 86.519.3 (H) 78.411.5 - 69.615.5 %   Platelets 268 150 - 400 K/uL   nRBC 0.5 (H) 0.0 - 0.2 %    Patient Active Problem List   Diagnosis Date Noted  . Vaginal bleeding in pregnancy, third trimester 10/06/2018    Assessment: Prudy FeelerCecilia Hirsch is a 28 y.o. G3P2002 at 9333w1d here for IOL in setting of vaginal bleeding and mild  contractions.  #Labor: IOL; s/p Foley bulb placement; plan for Pitocin PRN #Pain: Desires IV pain medications PRN; no epidural #FWB: Category 1 tracing as above #ID: GBS negative; no antibiotics needed #MOF: Breast  #  MOC: OCPs #Circ: [place] 10/06/2018, 3:37 PM   OB FELLOW HISTORY AND PHYSICAL ATTESTATION  I have seen and examined this patient; I agree with above documentation in the resident's note.   Phill Myron, D.O. OB Fellow  10/06/2018, 4:13 PM

## 2018-10-06 NOTE — Progress Notes (Signed)
LABOR PROGRESS NOTE Late documentation due to patient care.   Nancy Fisher is a 28 y.o. G3P2002 at [redacted]w[redacted]d  admitted for IOL due to vaginal bleeding.   Subjective: Doing well, not really feeling any contractions. Some cramping.   Objective: BP 136/66   Pulse 73   Temp 98.6 F (37 C) (Oral)   Resp 18   Ht 5\' 3"  (1.6 m)   Wt 97.5 kg   SpO2 99%   BMI 38.09 kg/m  or  Vitals:   10/06/18 1818 10/06/18 1949 10/06/18 2313 10/06/18 2320  BP: 129/82 128/88  136/66  Pulse: 86 80  73  Resp: 20 18  18   Temp: 98.2 F (36.8 C)  98.6 F (37 C)   TempSrc: Oral  Oral   SpO2:      Weight:      Height:        Dilation: 3 Effacement (%): 50 Cervical Position: Posterior Station: -3 Presentation: Vertex Exam by:: Jeanann Lewandowsky, RN FHT: baseline rate 150, moderate varibility, +acel, -decel Toco: Irregular   Labs: Lab Results  Component Value Date   WBC 8.7 10/06/2018   HGB 10.1 (L) 10/06/2018   HCT 32.4 (L) 10/06/2018   MCV 83.3 10/06/2018   PLT 268 10/06/2018    Patient Active Problem List   Diagnosis Date Noted  . Vaginal bleeding in pregnancy, third trimester 10/06/2018    Assessment / Plan: 28 y.o. G3P2002 at [redacted]w[redacted]d here for IOL due to vaginal bleeding. Minimal vaginal bleeding since admission.   Labor: FB out around 2200, will start cytotec x1 for further cervical softening. Recheck in 4 hours or as needed.  Fetal Wellbeing:  Cat 1 strip  Pain Control:  Natural delivery  Anticipated MOD:  SVD  Darrelyn Hillock, D.O. Family Medicine PGY-2   10/06/2018, 11:34 PM

## 2018-10-06 NOTE — MAU Note (Signed)
Just had a gush of blood, like she was on her period about 30 min ago.  Still coming out.  Has been cramping in the front and the back. No placental issues noted on [place].

## 2018-10-06 NOTE — Progress Notes (Signed)
Nancy Fisher is a 28 y.o. G3P2002 at [redacted]w[redacted]d admitted for induction of labor in the setting of vaginal bleeding and mild contractions.   Subjective: Doing well with no concerns at this time. Foley bulb remains in place. Endorsing some increased discomfort in her lower abdomen. No increased vaginal bleeding or LOF.   Objective: BP 129/82   Pulse 86   Temp 98.2 F (36.8 C) (Oral)   Resp 20   Ht 5\' 3"  (1.6 m)   Wt 97.5 kg   SpO2 99%   BMI 38.09 kg/m  or  Vitals:   10/06/18 1601 10/06/18 1700 10/06/18 1750 10/06/18 1818  BP: 129/84   129/82  Pulse: 83   86  Resp: 18 20 18 20   Temp:    98.2 F (36.8 C)  TempSrc:    Oral  SpO2:      Weight:      Height:       Dilation: 2 Effacement (%): 80 Cervical Position: Posterior Station: -3 Presentation: Vertex Exam by:: Georgeanna Lea, MD FHT: Baseline rate 145, moderate varibility, +accels, -decels Toco: Irregular contractions   Labs: Lab Results  Component Value Date   WBC 8.7 10/06/2018   HGB 10.1 (L) 10/06/2018   HCT 32.4 (L) 10/06/2018   MCV 83.3 10/06/2018   PLT 268 10/06/2018    Patient Active Problem List   Diagnosis Date Noted  . Vaginal bleeding in pregnancy, third trimester 10/06/2018   Assessment / Plan: 28 y.o. G3P2002 at [redacted]w[redacted]d here for IOL in setting of vaginal bleeding and mild contractions.  Labor: IOL s/p Foley bulb placement which remains in place; irregular contraction pattern, anticipate Pitocin Fetal Wellbeing:  Category 1 tracing as above Pain Control:  IV Fentanyl PRN Anticipated MOD:  SVD  Vilma Meckel, MD  Family Medicine, PGY-2 10/06/2018, 7:32 PM

## 2018-10-06 NOTE — MAU Provider Note (Signed)
History     CSN: [id number]  Arrival date and time: 10/06/18 1128   None     Chief Complaint  Patient presents with  . Vaginal Bleeding  . Abdominal Pain   HPI   Ms.Nancy Fisher is a 28 y.o. female G3P2002 @ 34102w1d here in MAU with vaginal bleeding. Says she woke up this morning and noticed bright red blood on her underwear. She then passed a clot shortly after first noticing the bleeding. She has never had bleeding in this pregnancy. She is having cramping and mild contractions in her lower abdomen. + fetal movement. No leaking of water.   OB History    Gravida  3   Para  2   Term  2   Preterm      AB      Living  2     SAB      TAB      Ectopic      Multiple      Live Births  1           Past Medical History:  Diagnosis Date  . Anemia     Past Surgical History:  Procedure Laterality Date  . NO PAST SURGERIES      Family History  Problem Relation Age of Onset  . Other Neg Hx   . Alcohol abuse Neg Hx   . Arthritis Neg Hx   . Asthma Neg Hx   . Birth defects Neg Hx   . Cancer Neg Hx   . COPD Neg Hx   . Depression Neg Hx   . Diabetes Neg Hx   . Drug abuse Neg Hx   . Early death Neg Hx   . Hearing loss Neg Hx   . Heart disease Neg Hx   . Hyperlipidemia Neg Hx   . Hypertension Neg Hx   . Kidney disease Neg Hx   . Learning disabilities Neg Hx   . Mental illness Neg Hx   . Mental retardation Neg Hx   . Miscarriages / Stillbirths Neg Hx   . Stroke Neg Hx   . Vision loss Neg Hx     Social History   Tobacco Use  . Smoking status: Never Smoker  . Smokeless tobacco: Never Used  Substance Use Topics  . Alcohol use: No  . Drug use: No    Allergies: No Known Allergies  Medications Prior to Admission  Medication Sig Dispense Refill Last Dose  . Prenatal Vit-Fe Fumarate-FA (MULTIVITAMIN-PRENATAL) 27-0.8 MG TABS tablet Take 1 tablet by mouth daily at 12 noon.      No results found for this or any previous visit (from the past 48  hour(s)).  Review of Systems  Constitutional: Negative for fever.  Gastrointestinal: Positive for abdominal pain.  Genitourinary: Positive for vaginal bleeding.   Physical Exam   Blood pressure 137/79, pulse 79, temperature 98.3 F (36.8 C), temperature source Oral, resp. rate 18, height 5\' 3"  (1.6 m), weight 99.2 kg, SpO2 99 %, unknown if currently breastfeeding.  Physical Exam  Constitutional: She is oriented to person, place, and time. She appears well-developed and well-nourished. No distress.  HENT:  Head: Normocephalic.  Genitourinary:    Genitourinary Comments: Vagina - Small amount of dark red blood in the vagina  Cervix - small trickle of blood from cervix.  Dried blood noted on perineum  Bimanual exam: Dilation: 1 Effacement (%): 80 Cervical Position: Posterior Station: -3 Presentation: Vertex Exam by:: Nancy EastJ Jaynell Castagnola, NP Chaperone present  for exam.    Musculoskeletal: Normal range of motion.  Neurological: She is alert and oriented to person, place, and time.  Skin: Skin is warm. She is not diaphoretic.  Psychiatric: Her behavior is normal.   Fetal Tracing: Baseline: 135 bpm Variability: Moderate  Accelerations: 15x15 Decelerations: None Toco: Occasional with UI  MAU Course  Procedures  Pt informed that the ultrasound is considered a limited OB ultrasound and is not intended to be a complete ultrasound exam.  Patient also informed that the ultrasound is not being completed with the intent of assessing for fetal or placental anomalies or any pelvic abnormalities.  Explained that the purpose of today's ultrasound is to assess for  presentation.  Patient acknowledges the purpose of the exam and the limitations of the study.    Vertex position    MDM  [redacted]w[redacted]d with vaginal bleeding on exam more than bloody show.  Active fetus, Category 1 fetal tracing.   Assessment and Plan   A:  1. Vaginal bleeding in pregnancy, third trimester   2. [redacted] weeks gestation of  pregnancy     P:  Admit to labor and delivery Dr. Juleen China notified GBS negative  Vertex position.   Lezlie Lye, NP 10/06/2018 12:52 PM

## 2018-10-07 ENCOUNTER — Encounter (HOSPITAL_COMMUNITY): Payer: Self-pay | Admitting: *Deleted

## 2018-10-07 DIAGNOSIS — Z3A39 39 weeks gestation of pregnancy: Secondary | ICD-10-CM

## 2018-10-07 LAB — RPR: RPR Ser Ql: NONREACTIVE

## 2018-10-07 LAB — ABO/RH: ABO/RH(D): O POS

## 2018-10-07 MED ORDER — EPHEDRINE 5 MG/ML INJ: 10.0000 mg | INTRAVENOUS | Status: DC | PRN

## 2018-10-07 MED ORDER — FENTANYL CITRATE (PF) 100 MCG/2ML IJ SOLN
INTRAMUSCULAR | Status: AC
  Filled 2018-10-07: qty 2

## 2018-10-07 MED ORDER — ONDANSETRON HCL 4 MG/2ML IJ SOLN: 4.0000 mg | INTRAMUSCULAR | Status: DC | PRN

## 2018-10-07 MED ORDER — MISOPROSTOL 200 MCG PO TABS: 800.0000 ug | ORAL_TABLET | Freq: Once | ORAL | Status: DC

## 2018-10-07 MED ORDER — DIBUCAINE (PERIANAL) 1 % EX OINT: 1.0000 "application " | TOPICAL_OINTMENT | CUTANEOUS | Status: DC | PRN

## 2018-10-07 MED ORDER — WITCH HAZEL-GLYCERIN EX PADS: 1.0000 "application " | MEDICATED_PAD | CUTANEOUS | Status: DC | PRN

## 2018-10-07 MED ORDER — SODIUM CHLORIDE 0.9% FLUSH: 3.0000 mL | Freq: Two times a day (BID) | INTRAVENOUS | Status: DC

## 2018-10-07 MED ORDER — ACETAMINOPHEN 325 MG PO TABS: 650.0000 mg | ORAL_TABLET | ORAL | Status: DC | PRN

## 2018-10-07 MED ORDER — SODIUM CHLORIDE 0.9% FLUSH: 3.0000 mL | INTRAVENOUS | Status: DC | PRN

## 2018-10-07 MED ORDER — BENZOCAINE-MENTHOL 20-0.5 % EX AERO
1.0000 "application " | INHALATION_SPRAY | CUTANEOUS | Status: DC | PRN
  Administered 2018-10-07: 1 via TOPICAL
  Filled 2018-10-07: qty 56

## 2018-10-07 MED ORDER — PRENATAL MULTIVITAMIN CH
1.0000 | ORAL_TABLET | Freq: Every day | ORAL | Status: DC
  Administered 2018-10-07 – 2018-10-08 (×2): 1 via ORAL
  Filled 2018-10-07 (×2): qty 1

## 2018-10-07 MED ORDER — FENTANYL-BUPIVACAINE-NACL 0.5-0.125-0.9 MG/250ML-% EP SOLN: 12.0000 mL/h | EPIDURAL | Status: DC | PRN

## 2018-10-07 MED ORDER — LACTATED RINGERS IV SOLN: 500.0000 mL | Freq: Once | INTRAVENOUS | Status: DC

## 2018-10-07 MED ORDER — COCONUT OIL OIL
1.0000 "application " | TOPICAL_OIL | Status: DC | PRN
  Administered 2018-10-08: 1 via TOPICAL

## 2018-10-07 MED ORDER — SODIUM CHLORIDE 0.9 % IV SOLN: 250.0000 mL | INTRAVENOUS | Status: DC | PRN

## 2018-10-07 MED ORDER — FENTANYL CITRATE (PF) 100 MCG/2ML IJ SOLN
100.0000 ug | INTRAMUSCULAR | Status: DC | PRN
  Administered 2018-10-07 (×2): 100 ug via INTRAVENOUS
  Filled 2018-10-07: qty 2

## 2018-10-07 MED ORDER — SIMETHICONE 80 MG PO CHEW: 80.0000 mg | CHEWABLE_TABLET | ORAL | Status: DC | PRN

## 2018-10-07 MED ORDER — LIDOCAINE HCL (PF) 1 % IJ SOLN
INTRAMUSCULAR | Status: AC
  Filled 2018-10-07: qty 30

## 2018-10-07 MED ORDER — ONDANSETRON HCL 4 MG PO TABS: 4.0000 mg | ORAL_TABLET | ORAL | Status: DC | PRN

## 2018-10-07 MED ORDER — DIPHENHYDRAMINE HCL 25 MG PO CAPS: 25.0000 mg | ORAL_CAPSULE | Freq: Four times a day (QID) | ORAL | Status: DC | PRN

## 2018-10-07 MED ORDER — PHENYLEPHRINE 40 MCG/ML (10ML) SYRINGE FOR IV PUSH (FOR BLOOD PRESSURE SUPPORT): 80.0000 ug | PREFILLED_SYRINGE | INTRAVENOUS | Status: DC | PRN

## 2018-10-07 MED ORDER — MISOPROSTOL 200 MCG PO TABS
ORAL_TABLET | ORAL | Status: AC
  Administered 2018-10-07: 08:00:00 800 ug via RECTAL
  Filled 2018-10-07: qty 4

## 2018-10-07 MED ORDER — MEASLES, MUMPS & RUBELLA VAC IJ SOLR: 0.5000 mL | Freq: Once | INTRAMUSCULAR | Status: DC

## 2018-10-07 MED ORDER — IBUPROFEN 600 MG PO TABS
600.0000 mg | ORAL_TABLET | Freq: Four times a day (QID) | ORAL | Status: DC
  Administered 2018-10-07 – 2018-10-08 (×6): 600 mg via ORAL
  Filled 2018-10-07 (×6): qty 1

## 2018-10-07 MED ORDER — SENNOSIDES-DOCUSATE SODIUM 8.6-50 MG PO TABS
2.0000 | ORAL_TABLET | ORAL | Status: DC
  Administered 2018-10-07: 2 via ORAL
  Filled 2018-10-07: qty 2

## 2018-10-07 MED ORDER — ZOLPIDEM TARTRATE 5 MG PO TABS: 5.0000 mg | ORAL_TABLET | Freq: Every evening | ORAL | Status: DC | PRN

## 2018-10-07 MED ORDER — DIPHENHYDRAMINE HCL 50 MG/ML IJ SOLN: 12.5000 mg | INTRAMUSCULAR | Status: DC | PRN

## 2018-10-07 MED ORDER — TETANUS-DIPHTH-ACELL PERTUSSIS 5-2.5-18.5 LF-MCG/0.5 IM SUSP: 0.5000 mL | Freq: Once | INTRAMUSCULAR | Status: DC

## 2018-10-07 NOTE — Progress Notes (Signed)
Nancy Fisher is a 28 y.o. G3P2002 at [redacted]w[redacted]d  admitted for IOL due to vaginal bleeding.  Subjective: Starting to feel more painful contractions consistently for the past hour.   Objective: BP 139/87   Pulse 62   Temp 98.2 F (36.8 C) (Oral)   Resp 16   Ht 5\' 3"  (1.6 m)   Wt 97.5 kg   SpO2 99%   BMI 38.09 kg/m  or  Vitals:   10/07/18 0025 10/07/18 0130 10/07/18 0215 10/07/18 0319  BP: 134/85 126/88 127/89 139/87  Pulse: 67 69 67 62  Resp: 18 16 16    Temp:    98.2 F (36.8 C)  TempSrc:    Oral  SpO2:      Weight:      Height:        Dilation: 4 Effacement (%): 50 Cervical Position: Posterior Station: -2 Presentation: Vertex Exam by:: Dr. Higinio Plan FHT: baseline rate 140 moderate varibility, +acel, -decel Toco: every 2 min   Labs: Lab Results  Component Value Date   WBC 8.7 10/06/2018   HGB 10.1 (L) 10/06/2018   HCT 32.4 (L) 10/06/2018   MCV 83.3 10/06/2018   PLT 268 10/06/2018    Patient Active Problem List   Diagnosis Date Noted  . Vaginal bleeding in pregnancy, third trimester 10/06/2018    Assessment / Plan: 28 y.o. G3P2002 at [redacted]w[redacted]d here for IOL due to vaginal bleeding.   Labor: progressing, s/p cytotec x1 and FB. Given frequent consistent contractions,will continue to monitor with expectant management. Likely will AROM on next check, will consider pit 2x2 if contractions space.  Fetal Wellbeing:  Cat 1  Pain Control:  IV fent currently  Anticipated MOD:  SVD   Darrelyn Hillock, D.O. Family Medicine PGY-2   10/07/2018, 3:20 AM

## 2018-10-07 NOTE — Progress Notes (Signed)
Nancy Fisher is a 28 y.o. G3P2002 at [redacted]w[redacted]d  admitted for IOL due to vaginal bleeding.   Subjective: Continuing to feel painful contractions every 2 minutes.   Objective: BP 131/86   Pulse 74   Temp 98.2 F (36.8 C) (Oral)   Resp 20   Ht 5\' 3"  (1.6 m)   Wt 97.5 kg   SpO2 99%   BMI 38.09 kg/m  or  Vitals:   10/07/18 0401 10/07/18 0504 10/07/18 0643 10/07/18 0714  BP: 122/70 (!) 131/94 139/89 131/86  Pulse: 63 66 78 74  Resp:  20    Temp:      TempSrc:      SpO2:      Weight:      Height:        Dilation: 5.5 Effacement (%): 80 Cervical Position: Posterior Station: -2 Presentation: Vertex Exam by:: Dr. Higinio Plan FHT: baseline rate 130, moderate varibility, +acel, -decel Toco: every 1-3 min   Labs: Lab Results  Component Value Date   WBC 8.7 10/06/2018   HGB 10.1 (L) 10/06/2018   HCT 32.4 (L) 10/06/2018   MCV 83.3 10/06/2018   PLT 268 10/06/2018    Patient Active Problem List   Diagnosis Date Noted  . Vaginal bleeding in pregnancy, third trimester 10/06/2018    Assessment / Plan: 28 y.o. G3P2002 at [redacted]w[redacted]d here for IOL due to vaginal bleeding. Vaginal bleeding has remained minimal since admission.   Labor: Progressing well with expectant management, contracting consistently. AROM with scant clear fluid @ 0710. Will cont to monitor with serial cervical exams as needed.  Fetal Wellbeing: Cat 1  Pain Control:  IV fent as needed, considering epidural  Anticipated MOD:  SVD   Darrelyn Hillock, D.O. Family Medicine PGY-2 10/07/2018, 7:19 AM

## 2018-10-07 NOTE — Discharge Summary (Addendum)
Postpartum Discharge Summary     Patient Name: Nancy FeelerCecilia Fisher DOB: July 15, 1990 MRN: [id number]  Date of admission: 10/06/2018 Delivering Provider: Allayne StackBEARD, SAMANTHA N   Date of discharge: 10/08/2018  Admitting diagnosis: Bleeding; mild contractions Intrauterine pregnancy: 2938w2d     Secondary diagnosis:  Active Problems:   Vaginal bleeding in pregnancy, third trimester  Additional problems: None     Discharge diagnosis: Term Pregnancy Delivered                                                                                                Post partum procedures:None  Augmentation: AROM, Cytotec and Foley Balloon  Complications: None  Hospital course:  Induction of Labor With Vaginal Delivery   28 y.o. yo G3P2002 at 7038w2d was admitted to the hospital 10/06/2018 for induction of labor.  Indication for induction: Vaginal bleeding .  Patient had an uncomplicated labor course as follows: Membrane Rupture Time/Date: 7:09 AM ,10/07/2018   Intrapartum Procedures: Episiotomy: None [1]                                         Lacerations:  1st degree [2];Perineal [11]  Patient had delivery of a Viable infant.  Information for the patient's newborn:  Lynnda Childedro, Boy Hanny [[id number]][[id number]]  Delivery Method: Vaginal, Spontaneous(Filed from Delivery Summary)    10/07/2018  Details of delivery can be found in separate delivery note.  Patient had a routine postpartum course. Patient is discharged home 10/08/18.  Magnesium Sulfate recieved: No BMZ received: No  Physical exam  Vitals:   10/07/18 1530 10/07/18 1946 10/07/18 2339 10/08/18 0500  BP: 131/80 129/77 123/88 132/83  Pulse: 87 73 80 79  Resp: 20  16 16   Temp: 98.9 F (37.2 C) 98.3 F (36.8 C) 98.8 F (37.1 C) 97.8 F (36.6 C)  TempSrc: Oral Oral Oral Oral  SpO2: 98% 99% 100% 100%  Weight:      Height:       General: alert, cooperative and no distress Lochia: appropriate Uterine Fundus: firm DVT Evaluation: no evidence of DVT seen on  physical exam, no significant calf/ankle edema  Labs: Lab Results  Component Value Date   WBC 8.7 10/06/2018   HGB 10.1 (L) 10/06/2018   HCT 32.4 (L) 10/06/2018   MCV 83.3 10/06/2018   PLT 268 10/06/2018   CMP Latest Ref Rng & Units 11/30/2015  Glucose 65 - 99 mg/dL 782(N102(H)  BUN 6 - 20 mg/dL 16  Creatinine 5.620.44 - 1.301.00 mg/dL 8.650.74  Sodium 784135 - 696145 mmol/L 140  Potassium 3.5 - 5.1 mmol/L 4.1  Chloride 101 - 111 mmol/L 102  CO2 22 - 32 mmol/L 24  Calcium 8.9 - 10.3 mg/dL 8.9  Total Protein 6.0 - 8.3 g/dL -  Total Bilirubin 0.3 - 1.2 mg/dL -  Alkaline Phos 39 - 295117 U/L -  AST 0 - 37 U/L -  ALT 0 - 35 U/L -    Discharge instruction: per After Visit Summary and "Baby and Me Booklet".  After visit meds:  Allergies as of 10/08/2018   No Known Allergies     Medication List    TAKE these medications   ibuprofen 600 MG tablet Commonly known as: ADVIL Take 1 tablet (600 mg total) by mouth every 6 (six) hours.   multivitamin-prenatal 27-0.8 MG Tabs tablet Take 1 tablet by mouth daily at 12 noon.   senna-docusate 8.6-50 MG tablet Commonly known as: Senokot-S Take 2 tablets by mouth daily. Start taking on: October 09, 2018       Diet: low salt diet  Activity: Advance as tolerated. Pelvic rest for 6 weeks.   Outpatient follow up:4 weeks Follow up Appt:No future appointments. Follow up Visit: Follow-up Information    Department, [facility] Follow up.   Why: Make an appointment for 4 week follow up. Contact information: [place] [address] [phone]            Please schedule this patient for Postpartum visit in: 4 weeks with the following provider: Any provider Low risk pregnancy complicated by: None Delivery mode:  SVD Anticipated Birth Control:  Unsure; considering OCPs PP Procedures needed: None   Newborn Data: Live born female  Birth Weight: 2980 g  APGAR: 8, 9   Newborn Delivery   Birth date/time: 10/07/2018  08:08:00 Delivery type:       Baby Feeding: Bottle and Breast Disposition:home with mother   10/08/2018 Genia Del, MD  [facility] [place]  I have seen and examined this patient and agree with above documentation in the resident's note.   Phill Myron, D.O. OB Fellow  10/08/2018, 6:24 PM

## 2018-10-07 NOTE — Lactation Note (Signed)
This note was copied from a baby's chart. Lactation Consultation Note  Patient Name: Nancy Fisher Date: 10/07/2018   P3, Baby 3 hours old and cueing. Mother states she bf her first 2 children for approx 1 mo. and had difficulty latching. Reviewed hand expression and then baby opened wide in cradle hold and latched off and on with strong sucks. Mother has flat nipples but at this time, baby can accommodate mother's tissue. Repositioned mother to cross cradle hold to guide him on the breast for more depth.  Encouraged mother to sandwich breast. Feed on demand approximately 8-12 times per day.   Discussed basics and provided mother with lactation brochure.       Maternal Data    Feeding Feeding Type: Breast Fed  LATCH Score                   Interventions    Lactation Tools Discussed/Used     Consult Status      Nancy Fisher 10/07/2018, 12:07 PM

## 2018-10-08 MED ORDER — SENNOSIDES-DOCUSATE SODIUM 8.6-50 MG PO TABS: 2.0000 | ORAL_TABLET | ORAL | 0 refills | Status: DC

## 2018-10-08 MED ORDER — IBUPROFEN 600 MG PO TABS: 600.0000 mg | ORAL_TABLET | Freq: Four times a day (QID) | ORAL | 1 refills | Status: DC

## 2018-10-08 NOTE — Progress Notes (Signed)
MMR discussed with patient and vaccine education reviewed due to patient being Rubella non-immune. Patient verbalizes understanding of vaccine and declines vaccine at this time. She prefers to get the MMR [facility]

## 2018-10-08 NOTE — Progress Notes (Signed)
Post Partum Day 1 Subjective: no complaints, up ad lib, voiding, tolerating PO, + flatus and +BM  Objective: Blood pressure 132/83, pulse 79, temperature 97.8 F (36.6 C), temperature source Oral, resp. rate 16, height 5\' 3"  (1.6 m), weight 97.5 kg, SpO2 100 %, unknown if currently breastfeeding.  Physical Exam:  General: alert, cooperative and NAD Lochia: appropriate Uterine Fundus: firm DVT Evaluation: no evidence of DVT seen on physical exam, no significant calf/ankle edema  Recent Labs    10/06/18 1309  HGB 10.1*  HCT 32.4*   Assessment/Plan: Plan for discharge home tomorrow Breastfeeding going okay; milk not in yet but latching well Circumcision declined Contraception - unsure; counseled on abstaining from intercourse in the immediate 6 week PP period and possibility of conception   LOS: 2 days   Nancy Fisher 10/08/2018, 8:30 AM

## 2018-10-08 NOTE — Discharge Instructions (Signed)

## 2018-10-08 NOTE — Lactation Note (Signed)
This note was copied from a baby's chart. Lactation Consultation Note  Patient Name: Webster [id number] Date: 10/08/2018   Mom's nipples are bifurcated bilaterally & infant wanted to only latch to top portion. After observing infant at breast, a nipple shield was applied (size 24 was a better fit). Infant hungry and despite suckling there was no sign of milk transfer.   Ductal tissue is only noted at the bottom of her breasts. Breasts appear slightly widely-spaced. Mom reports that the most she was ever able to pump with her 1st child was 1.5 oz. Possible IGT was discussed with Mom. Supplementing at breast was offered, but Mom preferred to use bottle.   Feeding plan 1. Offer breast 2. Follow with bottle of formula/EBM 3. Pump (I provided a hand pump & reviewed cleaning it. Mom has Medicaid, but does not want to apply for [facility]).  Mom is content with feeding plan. Cleaning/sanitizing of nipple shield was also reviewed.   Matthias Hughs [facility] 10/08/2018, 12:22 PM

## 2018-10-23 ENCOUNTER — Inpatient Hospital Stay (HOSPITAL_COMMUNITY)
Admission: AD | Admit: 2018-10-23 | Discharge: 2018-10-23 | Disposition: A | Discharge status: Home / Self Care | Payer: Medicaid Other | Attending: Obstetrics and Gynecology | Admitting: Obstetrics and Gynecology

## 2018-10-23 ENCOUNTER — Other Ambulatory Visit: Payer: Self-pay

## 2018-10-23 ENCOUNTER — Encounter (HOSPITAL_COMMUNITY): Payer: Self-pay | Admitting: *Deleted

## 2018-10-23 DIAGNOSIS — R000 Tachycardia, unspecified: Secondary | ICD-10-CM | POA: Diagnosis present

## 2018-10-23 DIAGNOSIS — O9089 Other complications of the puerperium, not elsewhere classified: Secondary | ICD-10-CM | POA: Diagnosis not present

## 2018-10-23 LAB — CBC WITH DIFFERENTIAL/PLATELET
Abs Immature Granulocytes: 0.03 10*3/uL (ref 0.00–0.07)
Basophils Absolute: 0 10*3/uL (ref 0.0–0.1)
Basophils Relative: 0 %
Eosinophils Absolute: 0 10*3/uL (ref 0.0–0.5)
Eosinophils Relative: 0 %
HCT: 34 % — ABNORMAL LOW (ref 36.0–46.0)
Hemoglobin: 10.7 g/dL — ABNORMAL LOW (ref 12.0–15.0)
Immature Granulocytes: 0 %
Lymphocytes Relative: 12 %
Lymphs Abs: 1.4 10*3/uL (ref 0.7–4.0)
MCH: 26.1 pg (ref 26.0–34.0)
MCHC: 31.5 g/dL (ref 30.0–36.0)
MCV: 82.9 fL (ref 80.0–100.0)
Monocytes Absolute: 0.6 10*3/uL (ref 0.1–1.0)
Monocytes Relative: 6 %
Neutro Abs: 9 10*3/uL — ABNORMAL HIGH (ref 1.7–7.7)
Neutrophils Relative %: 82 %
Platelets: 432 10*3/uL — ABNORMAL HIGH (ref 150–400)
RBC: 4.1 MIL/uL (ref 3.87–5.11)
RDW: 19.4 % — ABNORMAL HIGH (ref 11.5–15.5)
WBC: 11.1 10*3/uL — ABNORMAL HIGH (ref 4.0–10.5)
nRBC: 0 % (ref 0.0–0.2)

## 2018-10-23 LAB — COMPREHENSIVE METABOLIC PANEL
ALT: 15 U/L (ref 0–44)
AST: 19 U/L (ref 15–41)
Albumin: 3.5 g/dL (ref 3.5–5.0)
Alkaline Phosphatase: 119 U/L (ref 38–126)
Anion gap: 11 (ref 5–15)
BUN: 9 mg/dL (ref 6–20)
CO2: 25 mmol/L (ref 22–32)
Calcium: 8.9 mg/dL (ref 8.9–10.3)
Chloride: 103 mmol/L (ref 98–111)
Creatinine, Ser: 0.86 mg/dL (ref 0.44–1.00)
GFR calc Af Amer: >60 mL/min (ref >60)
GFR calc non Af Amer: >60 mL/min (ref >60)
Glucose, Bld: 110 mg/dL — ABNORMAL HIGH (ref 70–99)
Potassium: 4.1 mmol/L (ref 3.5–5.1)
Sodium: 139 mmol/L (ref 135–145)
Total Bilirubin: 0.3 mg/dL (ref 0.3–1.2)
Total Protein: 7.2 g/dL (ref 6.5–8.1)

## 2018-10-23 LAB — URINALYSIS, ROUTINE W REFLEX MICROSCOPIC
Bacteria, UA: NONE SEEN
Bilirubin Urine: NEGATIVE
Glucose, UA: NEGATIVE mg/dL
Hgb urine dipstick: NEGATIVE
Ketones, ur: NEGATIVE mg/dL
Nitrite: NEGATIVE
Protein, ur: NEGATIVE mg/dL
Specific Gravity, Urine: 1.01 (ref 1.005–1.030)
pH: 7 (ref 5.0–8.0)

## 2018-10-23 MED ORDER — IBUPROFEN 800 MG PO TABS
800.0000 mg | ORAL_TABLET | Freq: Once | ORAL | Status: AC
  Administered 2018-10-23: 800 mg via ORAL
  Filled 2018-10-23: qty 1

## 2018-10-23 NOTE — MAU Provider Note (Signed)
History     CSN: [id number]  Arrival date and time: 10/23/18 1445   First Provider Initiated Contact with Patient 10/23/18 1529      Chief Complaint  Patient presents with  . Tachycardia   HPI Nancy Fisher is a 28 y.o. [phone] postpartum patient who presents to MAU with chief complaint of "racing heart", general malaise and "feeling like something is really wrong and I might die". These are recurring problems, onset day 4 or 5 postpartum. Patient is breastfeeding and eating during the day time. She is getting small amounts of sleep and states her partner and mother are helping her rest throughout the day. She denies heavy vaginal bleeding, SOB, , abdominal tenderness, chest pain, weakness or syncope.  Patient is s/p SVD on 10/07/18 over 1st degree perineal lac. She is exclusively breastfeeding. She denies SI, HI, IPV.  OB History    Gravida  3   Para  3   Term  3   Preterm      AB      Living  3     SAB      TAB      Ectopic      Multiple  0   Live Births  2           Past Medical History:  Diagnosis Date  . Anemia     Past Surgical History:  Procedure Laterality Date  . NO PAST SURGERIES      Family History  Problem Relation Age of Onset  . Other Neg Hx   . Alcohol abuse Neg Hx   . Arthritis Neg Hx   . Asthma Neg Hx   . Birth defects Neg Hx   . Cancer Neg Hx   . COPD Neg Hx   . Depression Neg Hx   . Diabetes Neg Hx   . Drug abuse Neg Hx   . Early death Neg Hx   . Hearing loss Neg Hx   . Heart disease Neg Hx   . Hyperlipidemia Neg Hx   . Hypertension Neg Hx   . Kidney disease Neg Hx   . Learning disabilities Neg Hx   . Mental illness Neg Hx   . Mental retardation Neg Hx   . Miscarriages / Stillbirths Neg Hx   . Stroke Neg Hx   . Vision loss Neg Hx     Social History   Tobacco Use  . Smoking status: Never Smoker  . Smokeless tobacco: Never Used  Substance Use Topics  . Alcohol use: No  . Drug use: No    Allergies: No Known  Allergies  Medications Prior to Admission  Medication Sig Dispense Refill Last Dose  . ibuprofen (ADVIL) 600 MG tablet Take 1 tablet (600 mg total) by mouth every 6 (six) hours. 60 tablet 1   . Prenatal Vit-Fe Fumarate-FA (MULTIVITAMIN-PRENATAL) 27-0.8 MG TABS tablet Take 1 tablet by mouth daily at 12 noon.     . senna-docusate (SENOKOT-S) 8.6-50 MG tablet Take 2 tablets by mouth daily. 20 tablet 0     Review of Systems  Constitutional: Positive for fatigue. Negative for chills and fever.  Respiratory: Negative for shortness of breath.   Gastrointestinal: Negative for abdominal pain, nausea and vomiting.  Genitourinary: Negative for dysuria, flank pain, frequency, pelvic pain, vaginal bleeding, vaginal discharge and vaginal pain.  Musculoskeletal: Negative for back pain.  Neurological: Negative for dizziness and headaches.  All other systems reviewed and are negative.  Physical Exam  Blood pressure 114/86, pulse 95, temperature 98.8 F (37.1 C), resp. rate 18, height 5\' 3"  (1.6 m), weight 88.9 kg, SpO2 99 %, unknown if currently breastfeeding.  Physical Exam  Nursing note and vitals reviewed. Constitutional: She is oriented to person, place, and time. She appears well-developed and well-nourished.  Cardiovascular: Normal rate.  Respiratory: Effort normal and breath sounds normal. No accessory muscle usage. No respiratory distress.  GI: Soft. She exhibits no distension. There is no abdominal tenderness. There is no rigidity, no rebound, no guarding and no CVA tenderness.  Genitourinary:    Genitourinary Comments: Scant discharge   Neurological: She is alert and oriented to person, place, and time.  Skin: Skin is warm and dry.  Psychiatric: She has a normal mood and affect. Her behavior is normal. Judgment and thought content normal.    MAU Course/MDM  Procedures   --Normal EKG --No concerning findings on PE, lab results  Patient Vitals for the past 24 hrs:  BP Temp Pulse  Resp SpO2 Height Weight  10/23/18 1714 126/76 - 74 16 - - -  10/23/18 1514 114/86 98.8 F (37.1 C) 95 18 99 % 5\' 3"  (1.6 m) 88.9 kg   Results for orders placed or performed during the hospital encounter of 10/23/18 (from the past 24 hour(s))  Urinalysis, Routine w reflex microscopic     Status: Abnormal   Collection Time: 10/23/18  3:55 PM  Result Value Ref Range   Color, Urine STRAW (A) YELLOW   APPearance CLEAR CLEAR   Specific Gravity, Urine 1.010 1.005 - 1.030   pH 7.0 5.0 - 8.0   Glucose, UA NEGATIVE NEGATIVE mg/dL   Hgb urine dipstick NEGATIVE NEGATIVE   Bilirubin Urine NEGATIVE NEGATIVE   Ketones, ur NEGATIVE NEGATIVE mg/dL   Protein, ur NEGATIVE NEGATIVE mg/dL   Nitrite NEGATIVE NEGATIVE   Leukocytes,Ua TRACE (A) NEGATIVE   RBC / HPF 0-5 0 - 5 RBC/hpf   WBC, UA 0-5 0 - 5 WBC/hpf   Bacteria, UA NONE SEEN NONE SEEN   Squamous Epithelial / LPF 0-5 0 - 5  CBC with Differential/Platelet     Status: Abnormal   Collection Time: 10/23/18  4:00 PM  Result Value Ref Range   WBC 11.1 (H) 4.0 - 10.5 K/uL   RBC 4.10 3.87 - 5.11 MIL/uL   Hemoglobin 10.7 (L) 12.0 - 15.0 g/dL   HCT 29.534.0 (L) 28.436.0 - 13.246.0 %   MCV 82.9 80.0 - 100.0 fL   MCH 26.1 26.0 - 34.0 pg   MCHC 31.5 30.0 - 36.0 g/dL   RDW 44.019.4 (H) 10.211.5 - 72.515.5 %   Platelets 432 (H) 150 - 400 K/uL   nRBC 0.0 0.0 - 0.2 %   Neutrophils Relative % 82 %   Neutro Abs 9.0 (H) 1.7 - 7.7 K/uL   Lymphocytes Relative 12 %   Lymphs Abs 1.4 0.7 - 4.0 K/uL   Monocytes Relative 6 %   Monocytes Absolute 0.6 0.1 - 1.0 K/uL   Eosinophils Relative 0 %   Eosinophils Absolute 0.0 0.0 - 0.5 K/uL   Basophils Relative 0 %   Basophils Absolute 0.0 0.0 - 0.1 K/uL   Immature Granulocytes 0 %   Abs Immature Granulocytes 0.03 0.00 - 0.07 K/uL  Comprehensive metabolic panel     Status: Abnormal   Collection Time: 10/23/18  4:00 PM  Result Value Ref Range   Sodium 139 135 - 145 mmol/L   Potassium 4.1 3.5 - 5.1 mmol/L  Chloride 103 98 - 111 mmol/L    CO2 25 22 - 32 mmol/L   Glucose, Bld 110 (H) 70 - 99 mg/dL   BUN 9 6 - 20 mg/dL   Creatinine, Ser 1.470.86 0.44 - 1.00 mg/dL   Calcium 8.9 8.9 - 82.910.3 mg/dL   Total Protein 7.2 6.5 - 8.1 g/dL   Albumin 3.5 3.5 - 5.0 g/dL   AST 19 15 - 41 U/L   ALT 15 0 - 44 U/L   Alkaline Phosphatase 119 38 - 126 U/L   Total Bilirubin 0.3 0.3 - 1.2 mg/dL   GFR calc non Af Amer >60 >60 mL/min   GFR calc Af Amer >60 >60 mL/min   Anion gap 11 5 - 15    Assessment and Plan  --28 y.o. G3P3003 16 days postpartum --Recovering appropriately --Increase PO hydration with water, incorporate frequent small meals to support lactation --Discharge home in stable condition  F/U: Patient to schedule postpartum evaluation at 4-6 weeks pp  Calvert CantorSamantha C Kennett Symes, CNM 10/23/2018, 6:31 PM

## 2018-10-23 NOTE — MAU Note (Signed)
PP 2 weeks. Pt stated about an hour ago she was feeling very tired after feeding her baby. Baby went to sleep so she tried to lay down and sleep too. When she laied down she could not fall asleep because her head started to hurt and then it felt like her heart was racing. She felt week and chilled. The heart racing went away after a few minutes but still has a mild/moderate headache. Has had the heart racing feeling a few times since delivery. Does not last long but feels weak while it its happening.

## 2018-10-23 NOTE — Discharge Instructions (Signed)

## 2018-12-11 DIAGNOSIS — B009 Herpesviral infection, unspecified: Secondary | ICD-10-CM | POA: Insufficient documentation

## 2018-12-30 DIAGNOSIS — N920 Excessive and frequent menstruation with regular cycle: Secondary | ICD-10-CM | POA: Insufficient documentation

## 2018-12-30 DIAGNOSIS — D649 Anemia, unspecified: Secondary | ICD-10-CM | POA: Insufficient documentation

## 2021-02-06 ENCOUNTER — Inpatient Hospital Stay (HOSPITAL_COMMUNITY)
Admission: AD | Admit: 2021-02-06 | Discharge: 2021-02-06 | Disposition: A | Discharge status: Home / Self Care | Payer: Medicaid Other | Attending: Obstetrics and Gynecology | Admitting: Obstetrics and Gynecology

## 2021-02-06 ENCOUNTER — Other Ambulatory Visit: Payer: Self-pay

## 2021-02-06 ENCOUNTER — Encounter (HOSPITAL_COMMUNITY): Payer: Self-pay | Admitting: Obstetrics and Gynecology

## 2021-02-06 ENCOUNTER — Inpatient Hospital Stay (HOSPITAL_COMMUNITY): Payer: Medicaid Other

## 2021-02-06 DIAGNOSIS — R109 Unspecified abdominal pain: Secondary | ICD-10-CM | POA: Insufficient documentation

## 2021-02-06 DIAGNOSIS — Z3A12 12 weeks gestation of pregnancy: Secondary | ICD-10-CM | POA: Insufficient documentation

## 2021-02-06 DIAGNOSIS — R102 Pelvic and perineal pain: Secondary | ICD-10-CM | POA: Insufficient documentation

## 2021-02-06 DIAGNOSIS — O209 Hemorrhage in early pregnancy, unspecified: Secondary | ICD-10-CM | POA: Insufficient documentation

## 2021-02-06 DIAGNOSIS — Z3491 Encounter for supervision of normal pregnancy, unspecified, first trimester: Secondary | ICD-10-CM

## 2021-02-06 DIAGNOSIS — O26851 Spotting complicating pregnancy, first trimester: Secondary | ICD-10-CM

## 2021-02-06 DIAGNOSIS — O26891 Other specified pregnancy related conditions, first trimester: Secondary | ICD-10-CM | POA: Insufficient documentation

## 2021-02-06 LAB — WET PREP, GENITAL
Clue Cells Wet Prep HPF POC: NONE SEEN
Sperm: NONE SEEN
Trich, Wet Prep: NONE SEEN
WBC, Wet Prep HPF POC: NONE SEEN
Yeast Wet Prep HPF POC: NONE SEEN

## 2021-02-06 LAB — CBC
HCT: 33.8 % — ABNORMAL LOW (ref 36.0–46.0)
Hemoglobin: 10.7 g/dL — ABNORMAL LOW (ref 12.0–15.0)
MCH: 23.8 pg — ABNORMAL LOW (ref 26.0–34.0)
MCHC: 31.7 g/dL (ref 30.0–36.0)
MCV: 75.1 fL — ABNORMAL LOW (ref 80.0–100.0)
Platelets: 318 10*3/uL (ref 150–400)
RBC: 4.5 MIL/uL (ref 3.87–5.11)
RDW: 17.9 % — ABNORMAL HIGH (ref 11.5–15.5)
WBC: 7.9 10*3/uL (ref 4.0–10.5)
nRBC: 0 % (ref 0.0–0.2)

## 2021-02-06 LAB — HCG, QUANTITATIVE, PREGNANCY: hCG, Beta Chain, Quant, S: 95605 m[IU]/mL — ABNORMAL HIGH (ref <5)

## 2021-02-06 LAB — POCT PREGNANCY, URINE: Preg Test, Ur: POSITIVE — AB

## 2021-02-06 NOTE — MAU Note (Signed)
I have had some spotting off and on for few wks. This past Sat the spotting was pink and Sunday I passed a long string of blood. Today saw the same bloody string. Last night I slept on R side and continue to have some pain in that area. Last wk I was sick and now just have a cough and thought maybe the coughing was causing the pain. LMP 11/10/20. Has not started care yet

## 2021-02-06 NOTE — MAU Provider Note (Addendum)
History     CSN: [id number]  Arrival date and time: 02/06/21 2016   Event Date/Time   First Provider Initiated Contact with Patient 02/06/21 2056      Chief Complaint  Patient presents with   Abdominal Pain   Vaginal Bleeding   HPI Nancy Fisher is a 30 y.o. G4P3003 at 12w 4d by LMP who presents to MAU with chief complaint of vaginal spotting. This is a new problem, onset a few weeks ago. She reports passing a long string of pink-tinged mucus on Sunday and again today. Her bleeding has been otherwise scant. She is not saturating pads. She is not experiencing associated acute symptoms like weakness, dizziness or syncope.  Patient also c/o right groin pain. This is a new problem, onset a few days ago. Pain score is 5/10. Pain is "sharp", does not radiate. She denies aggravating or alleviating factors. She has not taken medication or tried other treatments for this complaint.  OB History     Gravida  4   Para  3   Term  3   Preterm      AB      Living  3      SAB      IAB      Ectopic      Multiple  0   Live Births  2           Past Medical History:  Diagnosis Date   Anemia     Past Surgical History:  Procedure Laterality Date   NO PAST SURGERIES      Family History  Problem Relation Age of Onset   Other Neg Hx    Alcohol abuse Neg Hx    Arthritis Neg Hx    Asthma Neg Hx    Birth defects Neg Hx    Cancer Neg Hx    COPD Neg Hx    Depression Neg Hx    Diabetes Neg Hx    Drug abuse Neg Hx    Early death Neg Hx    Hearing loss Neg Hx    Heart disease Neg Hx    Hyperlipidemia Neg Hx    Hypertension Neg Hx    Kidney disease Neg Hx    Learning disabilities Neg Hx    Mental illness Neg Hx    Mental retardation Neg Hx    Miscarriages / Stillbirths Neg Hx    Stroke Neg Hx    Vision loss Neg Hx     Social History   Tobacco Use   Smoking status: Never   Smokeless tobacco: Never  Vaping Use   Vaping Use: Never used  Substance Use Topics    Alcohol use: No   Drug use: No    Allergies: No Known Allergies  Medications Prior to Admission  Medication Sig Dispense Refill Last Dose   ibuprofen (ADVIL) 600 MG tablet Take 1 tablet (600 mg total) by mouth every 6 (six) hours. 60 tablet 1    Prenatal Vit-Fe Fumarate-FA (MULTIVITAMIN-PRENATAL) 27-0.8 MG TABS tablet Take 1 tablet by mouth daily at 12 noon.      senna-docusate (SENOKOT-S) 8.6-50 MG tablet Take 2 tablets by mouth daily. 20 tablet 0     Review of Systems  Gastrointestinal:  Positive for abdominal pain.  Genitourinary:  Positive for vaginal bleeding.  All other systems reviewed and are negative. Physical Exam   Blood pressure 131/75, pulse 79, temperature 98.2 F (36.8 C), resp. rate 17, last menstrual period 11/10/2020, SpO2  100 %, unknown if currently breastfeeding.  Physical Exam Vitals and nursing note reviewed. Exam conducted with a chaperone present.  Constitutional:      Appearance: She is well-developed. She is obese. She is not ill-appearing.  Cardiovascular:     Rate and Rhythm: Normal rate.     Heart sounds: Normal heart sounds.  Pulmonary:     Effort: Pulmonary effort is normal.  Abdominal:     Palpations: Abdomen is soft.     Tenderness: There is no abdominal tenderness. There is no right CVA tenderness or left CVA tenderness.  Skin:    General: Skin is warm and dry.     Capillary Refill: Capillary refill takes less than 2 seconds.  Neurological:     Mental Status: She is alert and oriented to person, place, and time.    MAU Course  Procedures  Orders Placed This Encounter  Procedures   US OB LESS THAN 14 WEEKS WITH OB TRANSVAGINAL   Urinalysis, Routine w reflex microscopic Urine, Clean Catch   CBC   hCG, quantitative, pregnancy   Patient Vitals for the past 24 hrs:  BP Temp Pulse Resp SpO2  02/06/21 2044 131/75 -- -- -- --  02/06/21 2043 -- 98.2 F (36.8 C) 79 17 100 %   Report given to Tyler Aas, CNM who assumes care of patient  at this time.  Clayton Bibles, MSA, MSN, CNM Certified Nurse Midwife, [facility] for [employer], [facility] 02/06/21 9:16 PM  Assumed care of patient, reviewed ultrasound results:  US OB Comp Less 14 Wks  Result Date: 02/06/2021 CLINICAL DATA:  Vaginal bleeding. LMP: 11/10/2020 corresponding to an estimated gestational age of [redacted] weeks, 4 days. EXAM: OBSTETRIC <14 WK ULTRASOUND TECHNIQUE: Transabdominal ultrasound was performed for evaluation of the gestation as well as the maternal uterus and adnexal regions. COMPARISON:  None. FINDINGS: Intrauterine gestational sac: Single intrauterine gestational sac. Yolk sac:  Seen Embryo:  Present Cardiac Activity: Detected Heart Rate: 171 bpm CRL:   36 mm   10 w 3 d                  [place] EDC: 09/01/2021. Subchorionic hemorrhage:  None visualized. Maternal uterus/adnexae: The maternal ovaries unremarkable. IMPRESSION: Single live intrauterine pregnancy with an estimated gestational age of [redacted] weeks, 3 days behind age based on LMP. Electronically Signed   By: Elgie Collard M.D.   On: 02/06/2021 21:52     Took picture to patient and gave reassurance. No pain at time of reassessment, pt very reassured by pictures and presence of heartbeat.  Discussed round ligament pain and encouraged her to begin routine prenatal care. Pt has Medicaid and needs an office in [place], encouraged her to set up care at [facility].  Assessment and Plan  Vaginal bleeding affecting early pregnancy - Plan: Discharge patient  Spotting in first trimester - Plan: US OB Comp Less 14 Wks, US OB Comp Less 14 Wks, CANCELED: US OB LESS THAN 14 WEEKS WITH OB TRANSVAGINAL, CANCELED: US OB LESS THAN 14 WEEKS WITH OB TRANSVAGINAL  [redacted] weeks gestation of pregnancy - Plan: Discharge patient  Fetal heart tones present, first trimester - Plan: Discharge patient   Allergies as of 02/06/2021   No Known Allergies      Medication List     STOP  taking these medications    ibuprofen 600 MG tablet Commonly known as: ADVIL       TAKE these medications  multivitamin-prenatal 27-0.8 MG Tabs tablet Take 1 tablet by mouth daily at 12 noon.   senna-docusate 8.6-50 MG tablet Commonly known as: Senokot-S Take 2 tablets by mouth daily.        Edd Arbour, CNM, MSN, IBCLC Certified Nurse Midwife, [facility]

## 2021-02-06 NOTE — Progress Notes (Signed)
Edd Arbour CNM in earlier to discuss test results and d/c plan. Written and verbal d/c instructions given and understanding voiced.

## 2021-02-07 LAB — GC/CHLAMYDIA PROBE AMP (~~LOC~~) NOT AT ARMC
Chlamydia: NEGATIVE
Comment: NEGATIVE
Comment: NORMAL
Neisseria Gonorrhea: NEGATIVE

## 2021-02-20 LAB — OB RESULTS CONSOLE RUBELLA ANTIBODY, IGM: Rubella: NON-IMMUNE/NOT IMMUNE

## 2021-02-20 LAB — OB RESULTS CONSOLE HIV ANTIBODY (ROUTINE TESTING): HIV: NONREACTIVE

## 2021-02-20 LAB — OB RESULTS CONSOLE HEPATITIS B SURFACE ANTIGEN: Hepatitis B Surface Ag: NEGATIVE

## 2021-02-20 LAB — HEPATITIS C ANTIBODY: HCV Ab: UNDETERMINED

## 2021-02-20 LAB — OB RESULTS CONSOLE RPR: RPR: NONREACTIVE

## 2021-03-26 NOTE — L&D Delivery Note (Signed)
Delivery Note Nancy Fisher is a 31 y.o. G4P4004 at [redacted]w[redacted]d admitted for SROM.   GBS Status: Negative/-- (05/16 0000) Maximum Maternal Temperature: 98.1  Labor course: Initial SVE: 3.5//70/-2. Augmentation with: Pitocin. She then progressed to complete.  ROM: 14h 65m with clear fluid  Birth: At 2337 a viable female was delivered via spontaneous vaginal delivery (Presentation: cephalic;LOA). Nuchal cord present: No.  Shoulders and body delivered in usual fashion. Infant placed directly on mom's abdomen for bonding/skin-to-skin, baby dried and stimulated. Cord clamped x 2 after 1 minute and cut by FOB.  Cord blood collected.  The placenta separated spontaneously and delivered via gentle cord traction.  Pitocin infused rapidly IV per protocol.  Fundus firm with massage.  Placenta inspected and appears to be intact with a 3 VC.  Placenta/Cord with the following complications: none.  Cord pH: n/a Sponge and instrument count were correct x2.  Intrapartum complications:  None Anesthesia:  none Episiotomy: none Lacerations:  1st degree Suture Repair: 3.0 vicryl EBL (mL): 200   Infant: APGAR (1 MIN): 9   APGAR (5 MINS): 9    Infant weight: pending  Mom to postpartum.  Baby to Couplet care / Skin to Skin. Placenta to L&D   Plans to Breast and bottlefeed Contraception:  POPs vs Nuvaring Circumcision: declines  Note sent to [facility]: N/A, [facility] pt for pp visit.   Brand Males CNM 08/28/2021 2:43 AM

## 2021-04-07 ENCOUNTER — Ambulatory Visit: Payer: Medicaid Other | Attending: Nurse Practitioner

## 2021-04-07 ENCOUNTER — Other Ambulatory Visit: Payer: Self-pay

## 2021-04-07 ENCOUNTER — Other Ambulatory Visit: Payer: Self-pay | Admitting: Maternal & Fetal Medicine

## 2021-04-07 ENCOUNTER — Other Ambulatory Visit: Payer: Self-pay | Admitting: *Deleted

## 2021-04-07 DIAGNOSIS — O99212 Obesity complicating pregnancy, second trimester: Secondary | ICD-10-CM

## 2021-04-07 DIAGNOSIS — Z362 Encounter for other antenatal screening follow-up: Secondary | ICD-10-CM

## 2021-04-07 DIAGNOSIS — E669 Obesity, unspecified: Secondary | ICD-10-CM

## 2021-04-07 DIAGNOSIS — O99210 Obesity complicating pregnancy, unspecified trimester: Secondary | ICD-10-CM

## 2021-04-07 DIAGNOSIS — Z3A19 19 weeks gestation of pregnancy: Secondary | ICD-10-CM | POA: Diagnosis not present

## 2021-04-07 NOTE — Progress Notes (Unsigned)
S 

## 2021-05-05 ENCOUNTER — Encounter: Payer: Self-pay | Admitting: *Deleted

## 2021-05-05 ENCOUNTER — Other Ambulatory Visit: Payer: Self-pay

## 2021-05-05 ENCOUNTER — Ambulatory Visit: Payer: Medicaid Other | Admitting: *Deleted

## 2021-05-05 ENCOUNTER — Ambulatory Visit: Payer: Medicaid Other | Attending: Obstetrics and Gynecology

## 2021-05-05 VITALS — BP 112/57 | HR 74

## 2021-05-05 DIAGNOSIS — Z3A23 23 weeks gestation of pregnancy: Secondary | ICD-10-CM | POA: Diagnosis not present

## 2021-05-05 DIAGNOSIS — O99212 Obesity complicating pregnancy, second trimester: Secondary | ICD-10-CM | POA: Diagnosis not present

## 2021-05-05 DIAGNOSIS — Z6839 Body mass index (BMI) 39.0-39.9, adult: Secondary | ICD-10-CM | POA: Insufficient documentation

## 2021-05-05 DIAGNOSIS — E668 Other obesity: Secondary | ICD-10-CM | POA: Diagnosis not present

## 2021-05-05 DIAGNOSIS — Z362 Encounter for other antenatal screening follow-up: Secondary | ICD-10-CM | POA: Insufficient documentation

## 2021-05-05 DIAGNOSIS — O99210 Obesity complicating pregnancy, unspecified trimester: Secondary | ICD-10-CM | POA: Diagnosis present

## 2021-05-08 ENCOUNTER — Other Ambulatory Visit: Payer: Self-pay | Admitting: *Deleted

## 2021-05-08 DIAGNOSIS — Z6841 Body Mass Index (BMI) 40.0 and over, adult: Secondary | ICD-10-CM

## 2021-06-09 ENCOUNTER — Ambulatory Visit: Payer: Medicaid Other | Admitting: *Deleted

## 2021-06-09 ENCOUNTER — Other Ambulatory Visit: Payer: Self-pay

## 2021-06-09 ENCOUNTER — Other Ambulatory Visit: Payer: Self-pay | Admitting: *Deleted

## 2021-06-09 ENCOUNTER — Ambulatory Visit: Payer: Medicaid Other | Attending: Advanced Practice Midwife

## 2021-06-09 VITALS — BP 110/58 | HR 82

## 2021-06-09 DIAGNOSIS — Z6841 Body Mass Index (BMI) 40.0 and over, adult: Secondary | ICD-10-CM | POA: Insufficient documentation

## 2021-06-09 DIAGNOSIS — Z3A28 28 weeks gestation of pregnancy: Secondary | ICD-10-CM | POA: Insufficient documentation

## 2021-06-09 DIAGNOSIS — O99213 Obesity complicating pregnancy, third trimester: Secondary | ICD-10-CM | POA: Insufficient documentation

## 2021-06-09 DIAGNOSIS — R638 Other symptoms and signs concerning food and fluid intake: Secondary | ICD-10-CM

## 2021-06-09 DIAGNOSIS — O99212 Obesity complicating pregnancy, second trimester: Secondary | ICD-10-CM | POA: Diagnosis not present

## 2021-06-28 ENCOUNTER — Inpatient Hospital Stay (HOSPITAL_COMMUNITY)
Admission: AD | Admit: 2021-06-28 | Discharge: 2021-06-29 | Disposition: A | Discharge status: Home / Self Care | Payer: Medicaid Other | Attending: Obstetrics & Gynecology | Admitting: Obstetrics & Gynecology

## 2021-06-28 ENCOUNTER — Encounter (HOSPITAL_COMMUNITY): Payer: Self-pay | Admitting: Obstetrics & Gynecology

## 2021-06-28 ENCOUNTER — Other Ambulatory Visit: Payer: Self-pay

## 2021-06-28 DIAGNOSIS — O26893 Other specified pregnancy related conditions, third trimester: Secondary | ICD-10-CM | POA: Insufficient documentation

## 2021-06-28 DIAGNOSIS — O4693 Antepartum hemorrhage, unspecified, third trimester: Secondary | ICD-10-CM

## 2021-06-28 DIAGNOSIS — O4690 Antepartum hemorrhage, unspecified, unspecified trimester: Secondary | ICD-10-CM

## 2021-06-28 DIAGNOSIS — Z3A30 30 weeks gestation of pregnancy: Secondary | ICD-10-CM | POA: Diagnosis not present

## 2021-06-28 DIAGNOSIS — N898 Other specified noninflammatory disorders of vagina: Secondary | ICD-10-CM | POA: Diagnosis present

## 2021-06-28 DIAGNOSIS — O4700 False labor before 37 completed weeks of gestation, unspecified trimester: Secondary | ICD-10-CM | POA: Diagnosis not present

## 2021-06-28 MED ORDER — NIFEDIPINE 10 MG PO CAPS
10.0000 mg | ORAL_CAPSULE | ORAL | Status: DC | PRN
  Administered 2021-06-28: 10 mg via ORAL
  Filled 2021-06-28: qty 1

## 2021-06-28 NOTE — MAU Provider Note (Signed)
Chief Complaint:  Vaginal Bleeding (Pink mucous since am) ? ? Event Date/Time  ? First Provider Initiated Contact with Patient 06/28/21 2250   ?  ?HPI: Nancy Fisher is a 31 y.o. [phone] at 25w5dwho presents to maternity admissions reporting pelvic cramping "feels warm" and light pink mucous discharge. No recent intercourse (has had postcoital bleeding before). No history of preterm labor.Marland Kitchen ?She reports good fetal movement, denies LOF, vaginal itching/burning, urinary symptoms, h/a, dizziness, n/v, diarrhea, constipation or fever/chills.   ? ?Vaginal Bleeding ?The patient's primary symptoms include pelvic pain and vaginal bleeding. The patient's pertinent negatives include no genital itching, genital lesions or genital odor. This is a new problem. The current episode started today. The pain is mild. She is pregnant. Associated symptoms include abdominal pain. Pertinent negatives include no back pain, chills, constipation, diarrhea, fever, frequency or headaches. The vaginal discharge was bloody and mucoid (pink). The vaginal bleeding is spotting. She has not been passing clots. She has not been passing tissue. Nothing aggravates the symptoms. She has tried nothing for the symptoms.  ? ?RN Note ?Nancy Fisher is a 31 y.o. at [redacted]w[redacted]d here in MAU reporting: pink discharge with mucous-pt reports Braxton hicks contractions. But noticed lower back discomfort with braxton hicks contractions. Denies SROM or loss of fluid. Denies odor to discharge.  Endorses + fetal movement. States she has urinary incontinence.  ?  ?Onset of complaint: 0730 ? ?Past Medical History: ?Past Medical History:  ?Diagnosis Date  ? Anemia   ? ? ?Past obstetric history: ?OB History  ?Gravida Para Term Preterm AB Living  ?4 3 3     3   ?SAB IAB Ectopic Multiple Live Births  ?      0 2  ?  ?# Outcome Date GA Lbr Len/2nd Weight Sex Delivery Anes PTL Lv  ?4 Current           ?3 Term 10/07/18 [redacted]w[redacted]d 04:50 / 00:03 2980 g M Vag-Spont Local  LIV  ?2 Term  01/02/12 [redacted]w[redacted]d 06:55 / 00:03 3067 g F Vag-Spont Local  LIV  ?   Birth Comments: wdl  ?1 Term      Vag-Spont     ? ? ?Past Surgical History: ?Past Surgical History:  ?Procedure Laterality Date  ? NO PAST SURGERIES    ? ? ?Family History: ?Family History  ?Problem Relation Age of Onset  ? Other Neg Hx   ? Alcohol abuse Neg Hx   ? Arthritis Neg Hx   ? Asthma Neg Hx   ? Birth defects Neg Hx   ? Cancer Neg Hx   ? COPD Neg Hx   ? Depression Neg Hx   ? Diabetes Neg Hx   ? Drug abuse Neg Hx   ? Early death Neg Hx   ? Hearing loss Neg Hx   ? Heart disease Neg Hx   ? Hyperlipidemia Neg Hx   ? Hypertension Neg Hx   ? Kidney disease Neg Hx   ? Learning disabilities Neg Hx   ? Mental illness Neg Hx   ? Mental retardation Neg Hx   ? Miscarriages / Stillbirths Neg Hx   ? Stroke Neg Hx   ? Vision loss Neg Hx   ? ? ?Social History: ?Social History  ? ?Tobacco Use  ? Smoking status: Never  ? Smokeless tobacco: Never  ?Vaping Use  ? Vaping Use: Never used  ?Substance Use Topics  ? Alcohol use: No  ? Drug use: No  ? ? ?Allergies:  ?Allergies  ?  Allergen Reactions  ? Latex Itching  ? ? ?Meds:  ?Medications Prior to Admission  ?Medication Sig Dispense Refill Last Dose  ? Prenatal Vit-Fe Fumarate-FA (MULTIVITAMIN-PRENATAL) 27-0.8 MG TABS tablet Take 1 tablet by mouth daily at 12 noon.   06/27/2021  ? ? ?I have reviewed patient's Past Medical Hx, Surgical Hx, Family Hx, Social Hx, medications and allergies.  ? ?ROS:  ?Review of Systems  ?Constitutional:  Negative for chills and fever.  ?Gastrointestinal:  Positive for abdominal pain. Negative for constipation and diarrhea.  ?Genitourinary:  Positive for pelvic pain and vaginal bleeding. Negative for frequency.  ?Musculoskeletal:  Negative for back pain.  ?Neurological:  Negative for headaches.  ?Other systems negative ? ?Physical Exam  ?Patient Vitals for the past 24 hrs: ? BP Temp Temp src Pulse Resp SpO2 Height Weight  ?06/28/21 2228 126/73 97.7 ?F (36.5 ?C) Oral 84 18 99 % 5\' 3"  (1.6 m)  103.4 kg  ? ?Constitutional: Well-developed, well-nourished female in no acute distress.  ?Cardiovascular: normal rate  ?Respiratory: normal effort ?GI: Abd soft, non-tender, gravid appropriate for gestational age.   No rebound or guarding. ?MS: Extremities nontender, no edema, normal ROM ?Neurologic: Alert and oriented x 4.  ?GU: Neg CVAT. ? ?PELVIC EXAM: Cervix pink, visually closed, without lesion, scant white creamy discharge, vaginal walls and external genitalia normal ?No blood seen ?Cervix long and closed.  Presenting part not palpable.  ? ?FHT:  Baseline 140 , moderate variability, accelerations present, no decelerations ?Contractions: q 3-4 mins Irregular  ?  ?Labs: ?Results for orders placed or performed during the hospital encounter of 06/28/21 (from the past 24 hour(s))  ?Wet prep, genital     Status: Abnormal  ? Collection Time: 06/28/21 11:50 PM  ? Specimen: Vaginal  ?Result Value Ref Range  ? Yeast Wet Prep HPF POC NONE SEEN NONE SEEN  ? Trich, Wet Prep NONE SEEN NONE SEEN  ? Clue Cells Wet Prep HPF POC NONE SEEN NONE SEEN  ? WBC, Wet Prep HPF POC NONE SEEN (A) <10  ? Sperm NONE SEEN   ? ?GC/Chlamydia sent  ? ?Imaging:  ? ? ?MAU Course/MDM: ?I have ordered labs and reviewed results. No abnormalities seen ?NST reviewed, reactive ?Unable to do Fetal Fibronectin due to report of bleeding ? ?Treatments in MAU included Procardia x 1 dose which stopped contractions. Patient states she cannot feel them anymore.  ? ?Assessment: ?Single IUP at [redacted]w[redacted]d ?Pink discharge at home, none seen on exam ?Preterm uterine contractions without cervical change ? ?Plan: ?Discharge home ?Preterm Labor precautions and fetal kick counts ?Patient to return if she has recurrence of contractions or bleeding ?Follow up in Office for prenatal visits and recheck ?Encouraged to return if she develops worsening of symptoms, increase in pain, fever, or other concerning symptoms. ? ?Pt stable at time of discharge. ? ?[redacted]w[redacted]d CNM,  MSN ?Certified Nurse-Midwife ?06/28/2021 ?10:50 PM ?

## 2021-06-28 NOTE — MAU Note (Signed)
No vaginal or cervical bleeding per CNM with speculum exam ?

## 2021-06-28 NOTE — MAU Note (Signed)
Pt denies feeling contractions or cramping ?

## 2021-06-28 NOTE — MAU Note (Signed)
Arliene Rosenow is a 31 y.o. at [redacted]w[redacted]d here in MAU reporting: pink discharge with mucous-pt reports Braxton hicks contractions. But noticed lower back discomfort with braxton hicks contractions. Denies SROM or loss of fluid. Denies odor to discharge.  Endorses + fetal movement. States she has urinary incontinence.  ? ?Onset of complaint: 0730 ?Pain score:  ?Vitals:  ? 06/28/21 2228  ?BP: 126/73  ?Pulse: 84  ?Resp: 18  ?Temp: 97.7 ?F (36.5 ?C)  ?SpO2: 99%  ?   ?FHT:130bpm ?Lab orders placed from triage:  UA ?

## 2021-06-29 DIAGNOSIS — Z3A30 30 weeks gestation of pregnancy: Secondary | ICD-10-CM

## 2021-06-29 DIAGNOSIS — O4690 Antepartum hemorrhage, unspecified, unspecified trimester: Secondary | ICD-10-CM

## 2021-06-29 DIAGNOSIS — O4693 Antepartum hemorrhage, unspecified, third trimester: Secondary | ICD-10-CM

## 2021-06-29 DIAGNOSIS — O4700 False labor before 37 completed weeks of gestation, unspecified trimester: Secondary | ICD-10-CM

## 2021-06-29 LAB — WET PREP, GENITAL
Clue Cells Wet Prep HPF POC: NONE SEEN
Sperm: NONE SEEN
Trich, Wet Prep: NONE SEEN
WBC, Wet Prep HPF POC: NONE SEEN — AB (ref <10)
Yeast Wet Prep HPF POC: NONE SEEN

## 2021-06-29 LAB — GC/CHLAMYDIA PROBE AMP (~~LOC~~) NOT AT ARMC
Chlamydia: NEGATIVE
Comment: NEGATIVE
Comment: NORMAL
Neisseria Gonorrhea: NEGATIVE

## 2021-06-29 NOTE — MAU Note (Signed)
Pt has had no additional bleeding or pink discharge-continues to deny feeling cramping or contractions. ?

## 2021-06-29 NOTE — MAU Note (Signed)
2nd dose of Procardia held per CNM ? ?

## 2021-07-07 ENCOUNTER — Ambulatory Visit: Payer: Medicaid Other | Attending: Obstetrics and Gynecology

## 2021-07-07 ENCOUNTER — Ambulatory Visit: Payer: Medicaid Other | Admitting: *Deleted

## 2021-07-07 VITALS — BP 114/66 | HR 96

## 2021-07-07 DIAGNOSIS — Z362 Encounter for other antenatal screening follow-up: Secondary | ICD-10-CM | POA: Diagnosis not present

## 2021-07-07 DIAGNOSIS — O99213 Obesity complicating pregnancy, third trimester: Secondary | ICD-10-CM

## 2021-07-07 DIAGNOSIS — Z3A32 32 weeks gestation of pregnancy: Secondary | ICD-10-CM

## 2021-07-07 DIAGNOSIS — R638 Other symptoms and signs concerning food and fluid intake: Secondary | ICD-10-CM

## 2021-07-07 DIAGNOSIS — E669 Obesity, unspecified: Secondary | ICD-10-CM

## 2021-07-10 ENCOUNTER — Other Ambulatory Visit: Payer: Self-pay | Admitting: *Deleted

## 2021-07-19 ENCOUNTER — Ambulatory Visit: Payer: Medicaid Other | Admitting: *Deleted

## 2021-07-19 ENCOUNTER — Encounter: Payer: Self-pay | Admitting: *Deleted

## 2021-07-19 ENCOUNTER — Other Ambulatory Visit: Payer: Self-pay | Admitting: *Deleted

## 2021-07-19 ENCOUNTER — Ambulatory Visit: Payer: Medicaid Other | Attending: Maternal & Fetal Medicine

## 2021-07-19 VITALS — BP 111/66 | HR 72

## 2021-07-19 DIAGNOSIS — O99213 Obesity complicating pregnancy, third trimester: Secondary | ICD-10-CM | POA: Diagnosis not present

## 2021-07-19 DIAGNOSIS — Z3A33 33 weeks gestation of pregnancy: Secondary | ICD-10-CM | POA: Diagnosis not present

## 2021-07-19 DIAGNOSIS — Z6839 Body mass index (BMI) 39.0-39.9, adult: Secondary | ICD-10-CM

## 2021-07-22 ENCOUNTER — Inpatient Hospital Stay (HOSPITAL_COMMUNITY)
Admission: AD | Admit: 2021-07-22 | Discharge: 2021-07-23 | Disposition: A | Discharge status: Home / Self Care | Payer: Medicaid Other | Attending: Family Medicine | Admitting: Family Medicine

## 2021-07-22 ENCOUNTER — Encounter (HOSPITAL_COMMUNITY): Payer: Self-pay | Admitting: Family Medicine

## 2021-07-22 DIAGNOSIS — O479 False labor, unspecified: Secondary | ICD-10-CM

## 2021-07-22 DIAGNOSIS — Z3A34 34 weeks gestation of pregnancy: Secondary | ICD-10-CM | POA: Insufficient documentation

## 2021-07-22 NOTE — MAU Note (Signed)
.  Nancy Fisher is a 31 y.o. at [redacted]w[redacted]d here in MAU reporting: contractions that started at noon today feeling stronger period like cramping intermittently and pain in upper thighs and groin. Denies SROM, vaginal bleeding or bloody show. Has lower back pain x2 days but has lessened in intensity from yesterday. ?Onset of complaint: 1200 ?Pain score: 9 ?Vitals:  ? 07/22/21 2244  ?BP: 119/74  ?Pulse: 79  ?Resp: 18  ?Temp: 99 ?F (37.2 ?C)  ?SpO2: 98%  ?   ?FHT:147bpm ? ?

## 2021-07-23 DIAGNOSIS — Z3A34 34 weeks gestation of pregnancy: Secondary | ICD-10-CM

## 2021-07-23 DIAGNOSIS — O479 False labor, unspecified: Secondary | ICD-10-CM

## 2021-07-23 LAB — URINALYSIS, ROUTINE W REFLEX MICROSCOPIC
Bilirubin Urine: NEGATIVE
Glucose, UA: NEGATIVE mg/dL
Hgb urine dipstick: NEGATIVE
Ketones, ur: NEGATIVE mg/dL
Nitrite: NEGATIVE
Protein, ur: NEGATIVE mg/dL
Specific Gravity, Urine: 1.025 (ref 1.005–1.030)
pH: 7 (ref 5.0–8.0)

## 2021-07-23 MED ORDER — NIFEDIPINE 10 MG PO CAPS
10.0000 mg | ORAL_CAPSULE | Freq: Once | ORAL | Status: AC
  Administered 2021-07-23: 10 mg via ORAL
  Filled 2021-07-23: qty 1

## 2021-07-23 NOTE — MAU Provider Note (Signed)
Event Date/Time  ? First Provider Initiated Contact with Patient 07/23/21 [phone]   ?  ? ?S: Ms. Nancy Fisher is a 31 y.o. [phone] at [redacted]w[redacted]d  who presents to MAU today complaining contractions irregularly for 1 week. Today the pain felt stronger than a period cramp. She denies vaginal bleeding. She denies LOF. She reports normal fetal movement.   ? ?O: BP 119/74 (BP Location: Right Arm)   Pulse 79   Temp 99 ?F (37.2 ?C) (Oral)   Resp 18   Ht 5\' 3"  (1.6 m)   Wt 105.2 kg   LMP 11/10/2020   SpO2 98%   BMI 41.10 kg/m?  ?GENERAL: Well-developed, well-nourished female in no acute distress.  ?HEAD: Normocephalic, atraumatic.  ?CHEST: Normal effort of breathing, regular heart rate ?ABDOMEN: Soft, nontender, gravid ? ?Cervical exam:  ?Dilation: Fingertip ?Effacement (%): Thick ?Cervical Position: Posterior ?Exam by:: [email or web address], NP ? ? ?Fetal Monitoring: ?Baseline: 130 bpm ?Variability: Moderate  ?Accelerations: 15x15 ?Decelerations: None ?Contractions: UI ? ?Patient drank a liter of fluid and received 1 dose of procardia. Patient reports feeling better.  ? ?A: ?SIUP at [redacted]w[redacted]d  ?False labor ? ?P: ? ?DC Home ?Preterm labor precautions ?Return to MAU if symptoms worsen  ? ?[redacted]w[redacted]d, NP ?07/23/2021 12:30 AM  ?

## 2021-07-23 NOTE — MAU Note (Signed)
Patient is being discharged by the provider. AVS reviewed with patient. Patient verbalized understanding of  labor precautions. Patient left without distress.  ?

## 2021-07-28 ENCOUNTER — Ambulatory Visit: Payer: Medicaid Other

## 2021-08-03 ENCOUNTER — Other Ambulatory Visit: Payer: Medicaid Other

## 2021-08-04 ENCOUNTER — Ambulatory Visit: Payer: Medicaid Other | Attending: Maternal & Fetal Medicine

## 2021-08-04 ENCOUNTER — Encounter: Payer: Self-pay | Admitting: *Deleted

## 2021-08-04 ENCOUNTER — Ambulatory Visit: Payer: Medicaid Other | Admitting: *Deleted

## 2021-08-04 VITALS — BP 133/78 | HR 85

## 2021-08-04 DIAGNOSIS — Z6839 Body mass index (BMI) 39.0-39.9, adult: Secondary | ICD-10-CM

## 2021-08-04 DIAGNOSIS — Z3A36 36 weeks gestation of pregnancy: Secondary | ICD-10-CM | POA: Diagnosis not present

## 2021-08-04 DIAGNOSIS — O99213 Obesity complicating pregnancy, third trimester: Secondary | ICD-10-CM | POA: Diagnosis not present

## 2021-08-08 LAB — OB RESULTS CONSOLE GBS: GBS: NEGATIVE

## 2021-08-08 LAB — OB RESULTS CONSOLE GC/CHLAMYDIA
Chlamydia: NEGATIVE
Neisseria Gonorrhea: NEGATIVE

## 2021-08-11 ENCOUNTER — Other Ambulatory Visit: Payer: Medicaid Other

## 2021-08-11 ENCOUNTER — Ambulatory Visit: Payer: Medicaid Other | Attending: Maternal & Fetal Medicine

## 2021-08-18 ENCOUNTER — Ambulatory Visit: Payer: Medicaid Other | Admitting: *Deleted

## 2021-08-18 ENCOUNTER — Ambulatory Visit: Payer: Medicaid Other | Attending: Maternal & Fetal Medicine

## 2021-08-18 VITALS — BP 122/76 | HR 87

## 2021-08-18 DIAGNOSIS — Z3A38 38 weeks gestation of pregnancy: Secondary | ICD-10-CM | POA: Diagnosis not present

## 2021-08-18 DIAGNOSIS — O99213 Obesity complicating pregnancy, third trimester: Secondary | ICD-10-CM

## 2021-08-18 DIAGNOSIS — E669 Obesity, unspecified: Secondary | ICD-10-CM

## 2021-08-25 ENCOUNTER — Other Ambulatory Visit: Payer: Medicaid Other

## 2021-08-25 ENCOUNTER — Ambulatory Visit: Payer: Medicaid Other

## 2021-08-27 ENCOUNTER — Encounter (HOSPITAL_COMMUNITY): Payer: Self-pay | Admitting: Obstetrics and Gynecology

## 2021-08-27 ENCOUNTER — Inpatient Hospital Stay (HOSPITAL_COMMUNITY)
Admission: AD | Admit: 2021-08-27 | Discharge: 2021-08-29 | DRG: 807 | Disposition: A | Discharge status: Home / Self Care | Payer: Medicaid Other | Attending: Obstetrics and Gynecology | Admitting: Obstetrics and Gynecology

## 2021-08-27 DIAGNOSIS — O4292 Full-term premature rupture of membranes, unspecified as to length of time between rupture and onset of labor: Secondary | ICD-10-CM | POA: Diagnosis present

## 2021-08-27 DIAGNOSIS — O4202 Full-term premature rupture of membranes, onset of labor within 24 hours of rupture: Secondary | ICD-10-CM | POA: Diagnosis not present

## 2021-08-27 DIAGNOSIS — Z3A39 39 weeks gestation of pregnancy: Secondary | ICD-10-CM

## 2021-08-27 DIAGNOSIS — O42913 Preterm premature rupture of membranes, unspecified as to length of time between rupture and onset of labor, third trimester: Secondary | ICD-10-CM

## 2021-08-27 LAB — CBC
HCT: 34.3 % — ABNORMAL LOW (ref 36.0–46.0)
Hemoglobin: 11.3 g/dL — ABNORMAL LOW (ref 12.0–15.0)
MCH: 28.1 pg (ref 26.0–34.0)
MCHC: 32.9 g/dL (ref 30.0–36.0)
MCV: 85.3 fL (ref 80.0–100.0)
Platelets: 249 10*3/uL (ref 150–400)
RBC: 4.02 MIL/uL (ref 3.87–5.11)
RDW: 14.6 % (ref 11.5–15.5)
WBC: 9.3 10*3/uL (ref 4.0–10.5)
nRBC: 0 % (ref 0.0–0.2)

## 2021-08-27 LAB — TYPE AND SCREEN
ABO/RH(D): O POS
Antibody Screen: NEGATIVE

## 2021-08-27 LAB — POCT FERN TEST: POCT Fern Test: POSITIVE

## 2021-08-27 MED ORDER — ACETAMINOPHEN 325 MG PO TABS
650.0000 mg | ORAL_TABLET | ORAL | Status: DC | PRN
Start: 2021-08-27 — End: 2021-08-28

## 2021-08-27 MED ORDER — GABAPENTIN 100 MG PO CAPS
100.0000 mg | ORAL_CAPSULE | Freq: Once | ORAL | Status: DC
  Filled 2021-08-27: qty 1

## 2021-08-27 MED ORDER — FENTANYL CITRATE (PF) 100 MCG/2ML IJ SOLN
100.0000 ug | Freq: Once | INTRAMUSCULAR | Status: AC
  Administered 2021-08-27: 100 ug via INTRAVENOUS

## 2021-08-27 MED ORDER — LIDOCAINE HCL (PF) 1 % IJ SOLN
30.0000 mL | INTRAMUSCULAR | Status: AC | PRN
  Administered 2021-08-27: 30 mL via SUBCUTANEOUS
  Filled 2021-08-27: qty 30

## 2021-08-27 MED ORDER — OXYCODONE-ACETAMINOPHEN 5-325 MG PO TABS: 2.0000 | ORAL_TABLET | ORAL | Status: DC | PRN

## 2021-08-27 MED ORDER — OXYCODONE-ACETAMINOPHEN 5-325 MG PO TABS: 1.0000 | ORAL_TABLET | ORAL | Status: DC | PRN

## 2021-08-27 MED ORDER — LACTATED RINGERS IV SOLN: 500.0000 mL | INTRAVENOUS | Status: DC | PRN

## 2021-08-27 MED ORDER — OXYTOCIN BOLUS FROM INFUSION
333.0000 mL | Freq: Once | INTRAVENOUS | Status: AC
  Administered 2021-08-27: 333 mL via INTRAVENOUS

## 2021-08-27 MED ORDER — TERBUTALINE SULFATE 1 MG/ML IJ SOLN: 0.2500 mg | Freq: Once | INTRAMUSCULAR | Status: DC | PRN

## 2021-08-27 MED ORDER — TRANEXAMIC ACID-NACL 1000-0.7 MG/100ML-% IV SOLN: 1000.0000 mg | INTRAVENOUS | Status: DC

## 2021-08-27 MED ORDER — OXYTOCIN-SODIUM CHLORIDE 30-0.9 UT/500ML-% IV SOLN
2.5000 [IU]/h | INTRAVENOUS | Status: DC
  Administered 2021-08-28: 2.5 [IU]/h via INTRAVENOUS
  Filled 2021-08-27: qty 500

## 2021-08-27 MED ORDER — LACTATED RINGERS IV SOLN: INTRAVENOUS | Status: DC

## 2021-08-27 MED ORDER — OXYTOCIN-SODIUM CHLORIDE 30-0.9 UT/500ML-% IV SOLN
1.0000 m[IU]/min | INTRAVENOUS | Status: DC
  Administered 2021-08-27: 2 m[IU]/min via INTRAVENOUS

## 2021-08-27 MED ORDER — FENTANYL CITRATE (PF) 100 MCG/2ML IJ SOLN
INTRAMUSCULAR | Status: AC
  Filled 2021-08-27: qty 2

## 2021-08-27 MED ORDER — ONDANSETRON HCL 4 MG/2ML IJ SOLN
4.0000 mg | Freq: Four times a day (QID) | INTRAMUSCULAR | Status: DC | PRN
Start: 2021-08-27 — End: 2021-08-28

## 2021-08-27 MED ORDER — SOD CITRATE-CITRIC ACID 500-334 MG/5ML PO SOLN: 30.0000 mL | ORAL | Status: DC | PRN

## 2021-08-27 NOTE — Progress Notes (Signed)
Labor Progress Note Turner Kunzman is a 31 y.o. [phone] at [redacted]w[redacted]d presented for PROM at term  S:  Comfortable but feeling more painful ctx.  O:  BP 123/85   Pulse 80   Temp (!) 97.5 F (36.4 C) (Axillary)   Resp 17   Ht 5\' 3"  (1.6 m)   Wt 110.2 kg   LMP 11/10/2020   SpO2 98%   BMI 43.05 kg/m  EFM: baseline 140 bpm/ mod variability/ + accels/ no decels  Toco/IUPC: irreg SVE: Dilation: 3.5 Effacement (%): 70 Station: -2 Presentation: Vertex Exam by:: [email or web address], CNM   A/P: 31 y.o. 34 [redacted]w[redacted]d  1. Labor: latent 2. FWB: Cat I 3. Pain: analgesia/anesthesia/NO prn  Expectant mngt. Anticipate labor progress and SVD.  [redacted]w[redacted]d, CNM 3:42 PM

## 2021-08-27 NOTE — Progress Notes (Signed)
Pt informed that the ultrasound is considered a limited OB ultrasound and is not intended to be a complete ultrasound exam.  Patient also informed that the ultrasound is not being completed with the intent of assessing for fetal or placental anomalies or any pelvic abnormalities.  Explained that the purpose of today's ultrasound is to assess for  presentation.  Patient acknowledges the purpose of the exam and the limitations of the study.    Cephalic  Itsel Opfer, NP   

## 2021-08-27 NOTE — Progress Notes (Signed)
Labor Progress Note Nancy Fisher is a 30 y.o. [phone] at [redacted]w[redacted]d presented for PROM at term  S:  Feeling less ctx now.  O:  BP 133/75   Pulse 70   Temp 97.8 F (36.6 C) (Axillary)   Resp 17   Ht 5\' 3"  (1.6 m)   Wt 110.2 kg   LMP 11/10/2020   SpO2 98%   BMI 43.05 kg/m  EFM: baseline 135 bpm/ mod variability/ + accels/ no decels  Toco/IUPC: rare SVE: Dilation: 3.5 Effacement (%): 70 Station: -2 Presentation: Vertex Exam by:: Julianne Handler, CNM   A/P: 31 y.o. [id number] [redacted]w[redacted]d  1. Labor: latent 2. FWB: Cat I 3. Pain: analgesia/anesthesia/NO prn   No cervical change, recommend Pitocin IOL, pt agrees. Anticipate labor progress and SVD.  Julianne Handler, CNM 6:57 PM

## 2021-08-27 NOTE — H&P (Signed)
OBSTETRIC ADMISSION HISTORY AND PHYSICAL  Nancy Fisher is a 31 y.o. female [phone] with IUP at [redacted]w[redacted]d presenting for PROM at term. She reports +FMs. No blurry vision, headaches, peripheral edema, or RUQ pain. She plans on breast and formula feeding. She requests POPs or Nuvaring for birth control. Reports irregular mild ctx.  Dating: By 10w [place] --->  Estimated Date of Delivery: 09/01/21  Sono:    @[redacted]w[redacted]d , normal anatomy, ceph presentation, 2794g, 48%ile, EFW 6'3  Prenatal History/Complications: -anemia -obesity -rubella NI  Past Medical History: Past Medical History:  Diagnosis Date   Anemia     Past Surgical History: Past Surgical History:  Procedure Laterality Date   NO PAST SURGERIES      Obstetrical History: OB History     Gravida  4   Para  3   Term  3   Preterm      AB      Living  3      SAB      IAB      Ectopic      Multiple  0   Live Births  3           Social History: Social History   Socioeconomic History   Marital status: Single    Spouse name: Not on file   Number of children: 3   Years of education: Not on file   Highest education level: Not on file  Occupational History   Not on file  Tobacco Use   Smoking status: Never   Smokeless tobacco: Never  Vaping Use   Vaping Use: Never used  Substance and Sexual Activity   Alcohol use: No   Drug use: No   Sexual activity: Not Currently    Birth control/protection: None  Other Topics Concern   Not on file  Social History Narrative   Not on file   Social Determinants of Health   Financial Resource Strain: Not on file  Food Insecurity: Not on file  Transportation Needs: Not on file  Physical Activity: Not on file  Stress: Not on file  Social Connections: Not on file    Family History: Family History  Problem Relation Age of Onset   Other Neg Hx    Alcohol abuse Neg Hx    Arthritis Neg Hx    Asthma Neg Hx    Birth defects Neg Hx    Cancer Neg Hx    COPD Neg Hx     Depression Neg Hx    Diabetes Neg Hx    Drug abuse Neg Hx    Early death Neg Hx    Hearing loss Neg Hx    Heart disease Neg Hx    Hyperlipidemia Neg Hx    Hypertension Neg Hx    Kidney disease Neg Hx    Learning disabilities Neg Hx    Mental illness Neg Hx    Mental retardation Neg Hx    Miscarriages / Stillbirths Neg Hx    Stroke Neg Hx    Vision loss Neg Hx     Allergies: Allergies  Allergen Reactions   Latex Itching    Medications Prior to Admission  Medication Sig Dispense Refill Last Dose   Prenatal Vit-Fe Fumarate-FA (MULTIVITAMIN-PRENATAL) 27-0.8 MG TABS tablet Take 1 tablet by mouth daily at 12 noon.        Review of Systems:  All systems reviewed and negative except as stated in HPI  PE: Blood pressure 132/76, pulse 82, temperature (!) 97.2  F (36.2 C), temperature source Oral, resp. rate 17, last menstrual period 11/10/2020, SpO2 97 %, unknown if currently breastfeeding. General appearance: alert, cooperative, and no distress Lungs: regular rate and effort Heart: regular rate  Abdomen: soft, non-tender Extremities: Homans sign is negative, no sign of DVT Presentation: cephalic EFM: 135 bpm, mod variability, + accels, no decels Toco: irregular SVE: deferred  Prenatal labs: ABO, Rh:  O pos Antibody:  neg Rubella:  NI RPR:  NR  HBsAg:  NR  HIV:  NR  GBS:  neg  1 hr GTT 129  Prenatal Transfer Tool  Maternal Diabetes: No Genetic Screening: Declined Maternal Ultrasounds/Referrals: Normal Fetal Ultrasounds or other Referrals:  None Maternal Substance Abuse:  No Significant Maternal Medications:  None Significant Maternal Lab Results: Group B Strep negative  No results found for this or any previous visit (from the past 24 hour(s)).  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 G4P3003 at [redacted]w[redacted]d here for PROM at term  1. Labor: latent 2. FWB: Cat I 3. Pain: analgesia/anesthesia prn 4. GBS: neg   Plan: Admit to LD Expectant mngt  Donette Larry, CNM  08/27/2021, 1:00 PM

## 2021-08-27 NOTE — MAU Note (Signed)
.  Nancy Fisher is a 31 y.o. at [redacted]w[redacted]d here in MAU reporting: vag spotting with mucous that started this morning around 0500, pt also reports a small gush of fluid around 0900 this morning.  Reports she has had some trickling since then. +FM.  Reports some lower abd cramps that have been ongoing for the past week.   Onset of complaint: 0500 Pain score: 6 There were no vitals filed for this visit.   FHT:140 Lab orders placed from triage:

## 2021-08-28 ENCOUNTER — Encounter (HOSPITAL_COMMUNITY): Payer: Self-pay | Admitting: Obstetrics and Gynecology

## 2021-08-28 LAB — RPR: RPR Ser Ql: NONREACTIVE

## 2021-08-28 MED ORDER — ZOLPIDEM TARTRATE 5 MG PO TABS: 5.0000 mg | ORAL_TABLET | Freq: Every evening | ORAL | Status: DC | PRN

## 2021-08-28 MED ORDER — SIMETHICONE 80 MG PO CHEW: 80.0000 mg | CHEWABLE_TABLET | ORAL | Status: DC | PRN

## 2021-08-28 MED ORDER — COCONUT OIL OIL: 1.0000 "application " | TOPICAL_OIL | Status: DC | PRN

## 2021-08-28 MED ORDER — IBUPROFEN 600 MG PO TABS
600.0000 mg | ORAL_TABLET | Freq: Four times a day (QID) | ORAL | Status: DC
  Administered 2021-08-28 – 2021-08-29 (×5): 600 mg via ORAL
  Filled 2021-08-28 (×6): qty 1

## 2021-08-28 MED ORDER — BENZOCAINE-MENTHOL 20-0.5 % EX AERO
1.0000 "application " | INHALATION_SPRAY | CUTANEOUS | Status: DC | PRN
  Administered 2021-08-28: 1 via TOPICAL
  Filled 2021-08-28: qty 56

## 2021-08-28 MED ORDER — ACETAMINOPHEN 325 MG PO TABS: 650.0000 mg | ORAL_TABLET | ORAL | Status: DC | PRN

## 2021-08-28 MED ORDER — WITCH HAZEL-GLYCERIN EX PADS: 1.0000 "application " | MEDICATED_PAD | CUTANEOUS | Status: DC | PRN

## 2021-08-28 MED ORDER — SENNOSIDES-DOCUSATE SODIUM 8.6-50 MG PO TABS
2.0000 | ORAL_TABLET | Freq: Every day | ORAL | Status: DC
  Administered 2021-08-29: 2 via ORAL
  Filled 2021-08-28: qty 2

## 2021-08-28 MED ORDER — PRENATAL MULTIVITAMIN CH
1.0000 | ORAL_TABLET | Freq: Every day | ORAL | Status: DC
  Administered 2021-08-28 – 2021-08-29 (×2): 1 via ORAL
  Filled 2021-08-28 (×2): qty 1

## 2021-08-28 MED ORDER — DIBUCAINE (PERIANAL) 1 % EX OINT: 1.0000 "application " | TOPICAL_OINTMENT | CUTANEOUS | Status: DC | PRN

## 2021-08-28 MED ORDER — MEASLES, MUMPS & RUBELLA VAC IJ SOLR: 0.5000 mL | Freq: Once | INTRAMUSCULAR | Status: DC

## 2021-08-28 MED ORDER — DIPHENHYDRAMINE HCL 25 MG PO CAPS: 25.0000 mg | ORAL_CAPSULE | Freq: Four times a day (QID) | ORAL | Status: DC | PRN

## 2021-08-28 MED ORDER — TETANUS-DIPHTH-ACELL PERTUSSIS 5-2.5-18.5 LF-MCG/0.5 IM SUSY: 0.5000 mL | PREFILLED_SYRINGE | Freq: Once | INTRAMUSCULAR | Status: DC

## 2021-08-28 MED ORDER — ONDANSETRON HCL 4 MG/2ML IJ SOLN: 4.0000 mg | INTRAMUSCULAR | Status: DC | PRN

## 2021-08-28 MED ORDER — ONDANSETRON HCL 4 MG PO TABS: 4.0000 mg | ORAL_TABLET | ORAL | Status: DC | PRN

## 2021-08-28 NOTE — Progress Notes (Addendum)
Post Partum Day 1 Subjective: no complaints, up ad lib, voiding, tolerating PO, and + flatus  Objective: Blood pressure 128/78, pulse 86, temperature 98.7 F (37.1 C), resp. rate 16, height 5' 3"  (1.6 m), weight 110.2 kg, last menstrual period 11/10/2020, SpO2 99 %, unknown if currently breastfeeding.  Physical Exam:  General: alert, cooperative, and no distress Lochia: appropriate Uterine Fundus: firm DVT Evaluation: No evidence of DVT seen on physical exam.  Recent Labs    08/27/21 1318  HGB 11.3*  HCT 34.3*   Assessment/Plan: Plan for discharge tomorrow and Contraception : Patient is considering starting with POPs and switch to Nuvaring at [facility].    LOS: 1 day   Spero Curb 08/28/2021, 7:38 AM     Midwife attestation I have seen and examined this patient and agree with above documentation in the student's note.   Post Partum Day 1  Nancy Fisher is a 31 y.o. [id number] s/p SVD.  Pt denies problems with ambulating, voiding or po intake. Pain is well controlled. Method of Feeding: breast  PE:  Gen: well appearing Heart: reg rate Lungs: normal WOB Fundus firm Ext: soft, no pain, no edema  Assessment: S/p SVD PPD #1 Rubella non-immune  Plan for discharge: tomorrow MMR  Julianne Handler, CNM 11:18 AM

## 2021-08-28 NOTE — Discharge Summary (Signed)
Postpartum Discharge Summary  Date of Service updated***     Patient Name: Nancy Fisher DOB: [birth year] MRN: [id number]  Date of admission: 08/27/2021 Delivery date:08/27/2021  Delivering provider: Renee Harder  Date of discharge: 08/28/2021  Admitting diagnosis: Indication for care in labor and delivery, antepartum [O75.9] Intrauterine pregnancy: [redacted]w[redacted]d    Secondary diagnosis:  Principal Problem:   Indication for care in labor and delivery, antepartum Active Problems:   SVD (spontaneous vaginal delivery)  Additional problems: ***    Discharge diagnosis: Term Pregnancy Delivered                                              Post partum procedures:{Postpartum procedures:23558} Augmentation: Pitocin Complications: None  Hospital course: Onset of Labor With Vaginal Delivery      31y.o. yo [id number] 379w2das admitted in Latent Labor on 08/27/2021. Patient had an uncomplicated labor course as follows:  Membrane Rupture Time/Date: 9:00 AM ,08/27/2021   Delivery Method:Vaginal, Spontaneous  Episiotomy: None  Lacerations:  1st degree  Patient had an uncomplicated postpartum course.  She is ambulating, tolerating a regular diet, passing flatus, and urinating well. Patient is discharged home in stable condition on 08/28/21.  Newborn Data: Birth date:08/27/2021  Birth time:11:37 PM  Gender:Female  Living status:Living  Apgars:9 ,9  Weight:3147 g   Magnesium Sulfate received: {Mag received:30440022} BMZ received: No Rhophylac:N/A MMR:{MMR:30440033} T-DaP:{Tdap:23962} Flu: {F{OYD:74128}ransfusion:{Transfusion received:30440034}  Physical exam  Vitals:   08/28/21 0103 08/28/21 0116 08/28/21 0154 08/28/21 0319  BP: 127/74 130/64 132/82 128/78  Pulse: 82 82 99 86  Resp:      Temp:   98.7 F (37.1 C)   TempSrc:      SpO2:   99%   Weight:      Height:       General: {Exam; general:21111117} Lochia: {Desc; appropriate/inappropriate:30686::"appropriate"} Uterine Fundus:  {Desc; firm/soft:30687} Incision: {Exam; incision:21111123} DVT Evaluation: {Exam; dvt:2111122} Labs: Lab Results  Component Value Date   WBC 9.3 08/27/2021   HGB 11.3 (L) 08/27/2021   HCT 34.3 (L) 08/27/2021   MCV 85.3 08/27/2021   PLT 249 08/27/2021      Latest Ref Rng & Units 10/23/2018    4:00 PM  CMP  Glucose 70 - 99 mg/dL 110    BUN 6 - 20 mg/dL 9    Creatinine 0.44 - 1.00 mg/dL 0.86    Sodium 135 - 145 mmol/L 139    Potassium 3.5 - 5.1 mmol/L 4.1    Chloride 98 - 111 mmol/L 103    CO2 22 - 32 mmol/L 25    Calcium 8.9 - 10.3 mg/dL 8.9    Total Protein 6.5 - 8.1 g/dL 7.2    Total Bilirubin 0.3 - 1.2 mg/dL 0.3    Alkaline Phos 38 - 126 U/L 119    AST 15 - 41 U/L 19    ALT 0 - 44 U/L 15     Edinburgh Score:     View : No data to display.           After visit meds:  Allergies as of 08/28/2021       Reactions   Latex Itching     Med Rec [facility] be completed [facility]        Discharge home in stable condition Infant Feeding: {Baby feeding:23562}  Infant Disposition:{CHL IP OB HOME WITH BSJGGE:36629} Discharge instruction: per After Visit Summary and Postpartum booklet. Activity: Advance as tolerated. Pelvic rest for 6 weeks.  Diet: {OB UTML:46503546} Future Appointments:No future appointments. Follow up Visit:  Patient to schedule postpartum visit at [facility] in 6 weeks Low risk pregnancy  Delivery mode:  Vaginal, Spontaneous  Anticipated Birth Control:   POP's vs Nuvaring   08/28/2021 Renee Harder, CNM

## 2021-08-28 NOTE — Lactation Note (Signed)
This note was copied from a baby's chart. Lactation Consultation Note Declines Lactation services at this time.  Patient Name: Nancy Fisher Date: 08/28/2021   Age:33 hours  Maternal Data    Feeding    LATCH Score Latch: Repeated attempts needed to sustain latch, nipple held in mouth throughout feeding, stimulation needed to elicit sucking reflex.  Audible Swallowing: A few with stimulation  Type of Nipple: Everted at rest and after stimulation  Comfort (Breast/Nipple): Soft / non-tender  Hold (Positioning): Assistance needed to correctly position infant at breast and maintain latch.  LATCH Score: 7   Lactation Tools Discussed/Used    Interventions Interventions: Skin to skin;Assisted with latch  Discharge    Consult Status Consult Status: Complete    Nancy Fisher 08/28/2021, 3:20 AM

## 2021-08-29 MED ORDER — NORETHINDRONE 0.35 MG PO TABS: 1.0000 | ORAL_TABLET | Freq: Every day | ORAL | 11 refills | Status: AC

## 2021-08-29 MED ORDER — ACETAMINOPHEN 325 MG PO TABS
650.0000 mg | ORAL_TABLET | ORAL | 0 refills | Status: AC | PRN
Start: 2021-08-29 — End: 2021-09-28

## 2021-08-29 MED ORDER — IBUPROFEN 600 MG PO TABS: 600.0000 mg | ORAL_TABLET | Freq: Four times a day (QID) | ORAL | 0 refills | Status: AC

## 2021-08-29 NOTE — Discharge Instructions (Signed)

## 2021-09-05 ENCOUNTER — Telehealth (HOSPITAL_COMMUNITY): Payer: Self-pay

## 2021-09-05 NOTE — Telephone Encounter (Signed)
No answer. Left message to return nurse call.  Marcelino Duster [facility] 06/13//2023,1312

## 2022-06-12 ENCOUNTER — Encounter: Payer: Self-pay | Admitting: *Deleted

## 2022-06-12 ENCOUNTER — Other Ambulatory Visit: Payer: Self-pay

## 2022-06-12 ENCOUNTER — Ambulatory Visit
Admission: EM | Admit: 2022-06-12 | Discharge: 2022-06-12 | Disposition: A | Discharge status: Home / Self Care | Payer: Medicaid Other

## 2022-06-12 DIAGNOSIS — M26622 Arthralgia of left temporomandibular joint: Secondary | ICD-10-CM | POA: Diagnosis not present

## 2022-06-12 DIAGNOSIS — G518 Other disorders of facial nerve: Secondary | ICD-10-CM | POA: Diagnosis not present

## 2022-06-12 NOTE — Discharge Instructions (Signed)
Advised take ibuprofen 600 mg every 6 hours with food to help decrease TMJ and nerve inflammation. Advised to soft tissue on the left side to avoid irritation.  Advised follow-up PCP return to urgent care as needed.

## 2022-06-12 NOTE — ED Provider Notes (Signed)
[facility]    CSN: [id number] Arrival date & time: 06/12/22  1004      History   Chief Complaint Chief Complaint  Patient presents with   tongue numbness    HPI Nancy Fisher is a 32 y.o. female.   32 year old female presents with left facial and tongue numbness.  Patient indicates that she is under some increased stress over the past 1 to 2 days with her son getting ready to go into surgery tomorrow.  Patient indicates for the past 1 to 2 days she has been having left facial numbness that is located at the back of the throat on the lower tooth area lower jaw, down into the upper part of the left side of the neck.  She indicates that the numbness is intermittent and comes and goes.  She also indicates that she has some numbness on the very tip of the tongue which is intermittent.  Patient indicates she has been chewing a lot on the left side over the past several days due to having dental surgery on the right side.  She indicates this may be causing some of the irritation.  She also does not know whether it is due to some foods that she has eaten recently that it may be contributing to the intermittent numbness.  She denies having any facial numbness or tingling, difficulty swallowing, fever or chills.  She is without numbness or tingling or weakness of the upper or lower extremities.  She is tolerating fluids well.     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 Authorizing Provider  ibuprofen (ADVIL) 600 MG tablet Take 1 tablet (600 mg total) by mouth every 6 (six) hours. 08/29/21   Darlina Rumpf, CNM  norethindrone (ORTHO MICRONOR) 0.35 MG tablet Take 1 tablet (0.35 mg total) by mouth daily. Do not start before September 24, 2021 08/29/21 08/29/22  Darlina Rumpf, CNM  Prenatal Vit-Fe Fumarate-FA (MULTIVITAMIN-PRENATAL) 27-0.8 MG TABS tablet Take 1 tablet by mouth daily at 12 noon.    [provider]    Family History Family History  Problem Relation Age of Onset   Other Neg Hx    Alcohol abuse Neg Hx    Arthritis Neg Hx    Asthma Neg Hx    Birth defects Neg Hx    Cancer Neg Hx    COPD Neg Hx    Depression Neg Hx  Diabetes Neg Hx    Drug abuse Neg Hx    Early death Neg Hx    Hearing loss Neg Hx    Heart disease Neg Hx    Hyperlipidemia Neg Hx    Hypertension Neg Hx    Kidney disease Neg Hx    Learning disabilities Neg Hx    Mental illness Neg Hx    Mental retardation Neg Hx    Miscarriages / Stillbirths Neg Hx    Stroke Neg Hx    Vision loss Neg Hx     Social History Social History   Tobacco Use   Smoking status: Never   Smokeless tobacco: Never  Vaping Use   Vaping Use: Never used  Substance Use Topics   Alcohol use: No   Drug use: No     Allergies   Latex   Review of Systems Review of Systems   Physical Exam Triage Vital Signs ED Triage Vitals  Enc Vitals Group     BP 06/12/22 1045 125/83     Pulse Rate 06/12/22 1045 85     Resp 06/12/22 1045 18     Temp 06/12/22 1045 98.4 F (36.9 C)     Temp src --      SpO2 06/12/22 1045 99 %     Weight --      Height --      Head Circumference --      Peak Flow --      Pain Score 06/12/22 1042 0     Pain Loc --      Pain  Edu? --      Excl. in [place]? --    No data found.  Updated Vital Signs BP 125/83   Pulse 85   Temp 98.4 F (36.9 C)   Resp 18   LMP 05/14/2022   SpO2 99%   Visual Acuity Right Eye Distance:   Left Eye Distance:   Bilateral Distance:    Right Eye Near:   Left Eye Near:    Bilateral Near:     Physical Exam Constitutional:      Appearance: Normal appearance.  HENT:     Right Ear: Tympanic membrane and ear canal normal.     Left Ear: Tympanic membrane and ear canal normal.     Mouth/Throat:     Mouth: Mucous membranes are moist.     Pharynx: Oropharynx is clear.     Comments: Throat: Uvula is midline, tongue is midline. Facial: Facial expressions are normal bilaterally. Cardiovascular:     Rate and Rhythm: Normal rate and regular rhythm.     Heart sounds: Normal heart sounds.  Pulmonary:     Effort: Pulmonary effort is normal.     Breath sounds: Normal breath sounds and air entry. No wheezing, rhonchi or rales.  Lymphadenopathy:     Cervical: No cervical adenopathy.  Neurological:     General: No focal deficit present.     Mental Status: She is alert.     Cranial Nerves: Cranial nerves 2-12 are intact.     Motor: Motor function is intact.     Coordination: Coordination is intact.      UC Treatments / Results  Labs (all labs ordered are listed, but only abnormal results are displayed) Labs Reviewed - No data to display  EKG   Radiology No results found.  Procedures Procedures (including critical care time)  Medications Ordered in UC Medications - No data to display  Initial Impression / Assessment  and Plan / UC Course  I have reviewed the triage vital signs and the nursing notes.  Pertinent labs & imaging results that were available during my care of the patient were reviewed by me and considered in my medical decision making (see chart for details).    Plan: The diagnosis will be treated with the following: 1.  Facial neuritis: A.  Advised take  ibuprofen 600 mg every 6 hours with food to help reduce nerve inflammation. 2.  Left TMJ arthralgia: A.  Advised take ibuprofen 600 mg every 6 hours with food to help reduce inflammation and discomfort. 3.  Advised follow-up PCP return to urgent care as needed. Final Clinical Impressions(s) / UC Diagnoses   Final diagnoses:  Facial neuritis  Arthralgia of left temporomandibular joint     Discharge Instructions      Advised take ibuprofen 600 mg every 6 hours with food to help decrease TMJ and nerve inflammation. Advised to soft tissue on the left side to avoid irritation.  Advised follow-up PCP return to urgent care as needed.    ED Prescriptions   None    PDMP not reviewed this encounter.   Nyoka Lint, PA-C 06/12/22 1122

## 2022-06-12 NOTE — ED Triage Notes (Signed)
Pt reports she has numbness on Lt side of tongue and Lt side of neck. Pt reports to be under a lot of stress because her son is having surgery tomorrow.  Pt throat is dry today and has not tried to eat anything today because of numbness. Pt's smile is equal with out droop.

## 2022-11-04 IMAGING — US US OB COMP LESS 14 WK
1 series · 15 of 28 positions shown · non-contrast
Comparison: None.

CLINICAL DATA: Vaginal bleeding. LMP: 11/10/2020 corresponding to
an estimated gestational age of 12 weeks, 4 days.

EXAM:
OBSTETRIC <14 WK ULTRASOUND
TECHNIQUE: Transabdominal ultrasound was performed for evaluation of the
gestation as well as the maternal uterus and adnexal regions.

[Series 1: us ob comp less 14 wk · 32 acquisitions, 15 frames shown]
[im 1/32]
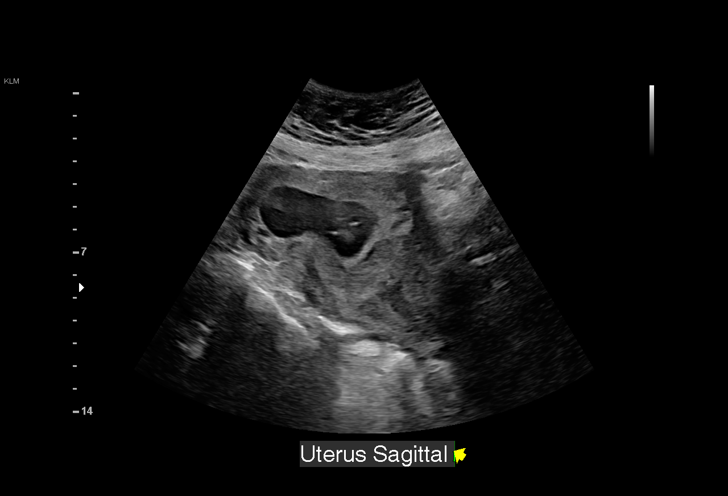
[im 3/32]
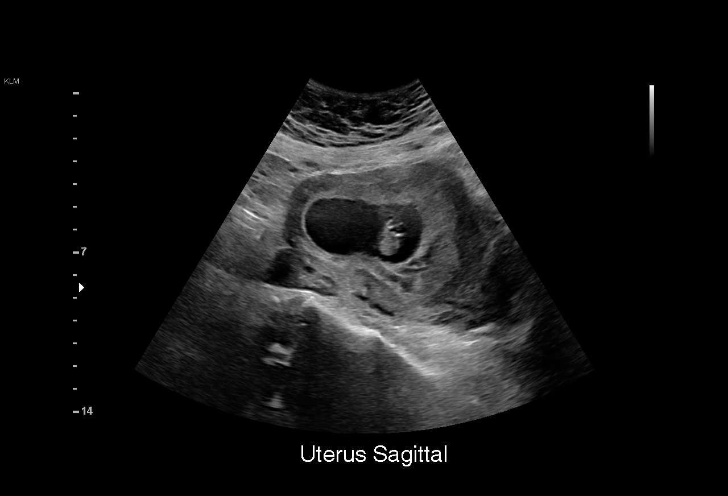
[im 5/32]
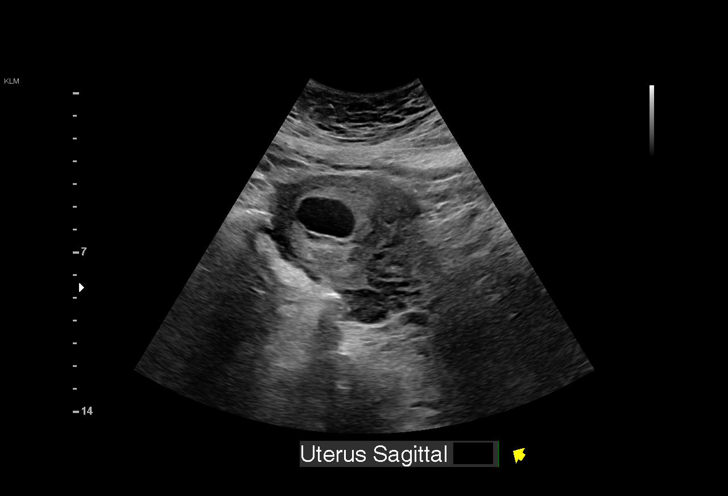
[im 7/32]
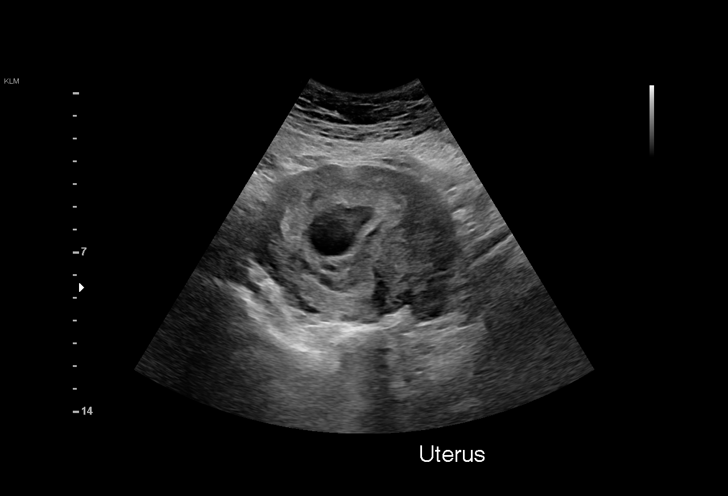
[im 10/32]
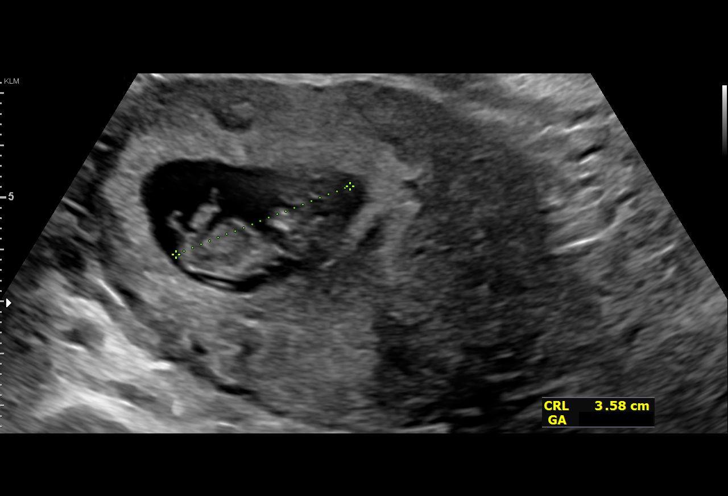
[im 12/32]
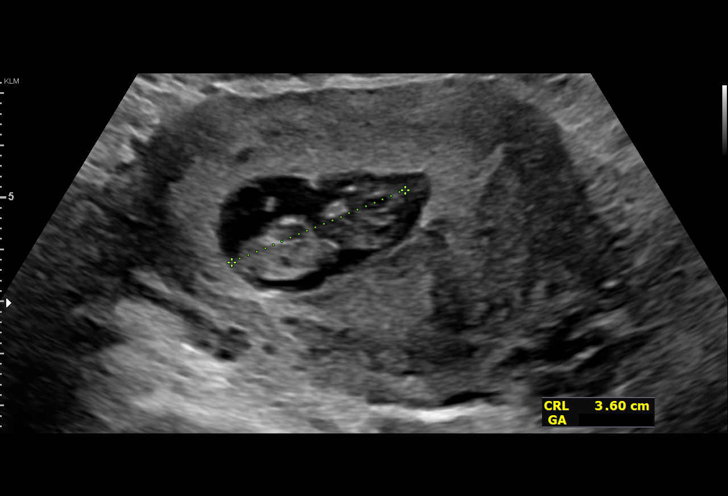
[im 14/32]
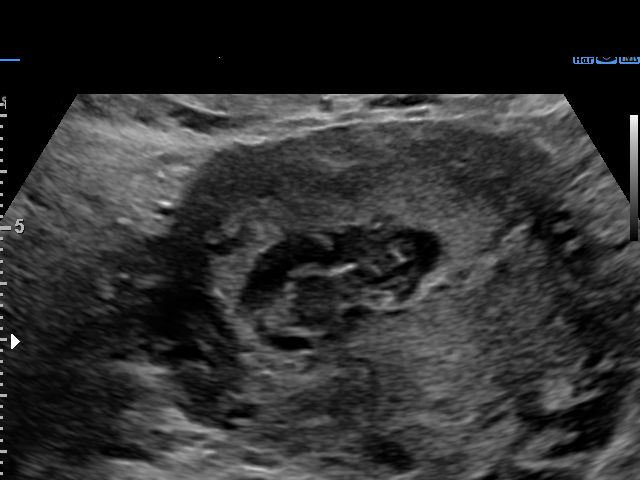
[im 17/32]
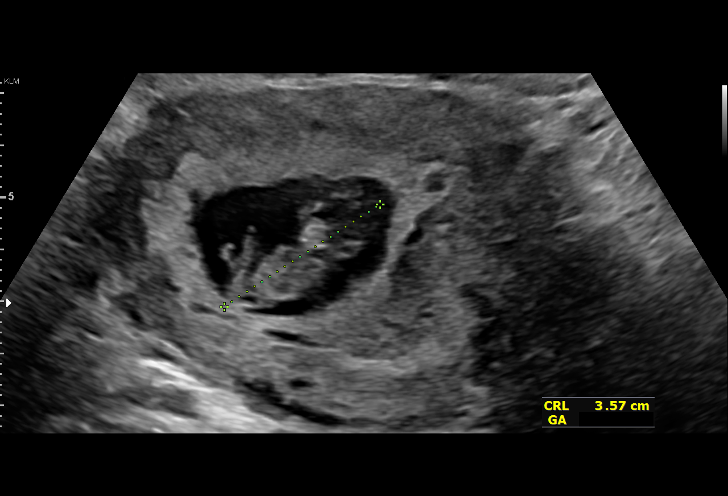
[im 18/32]
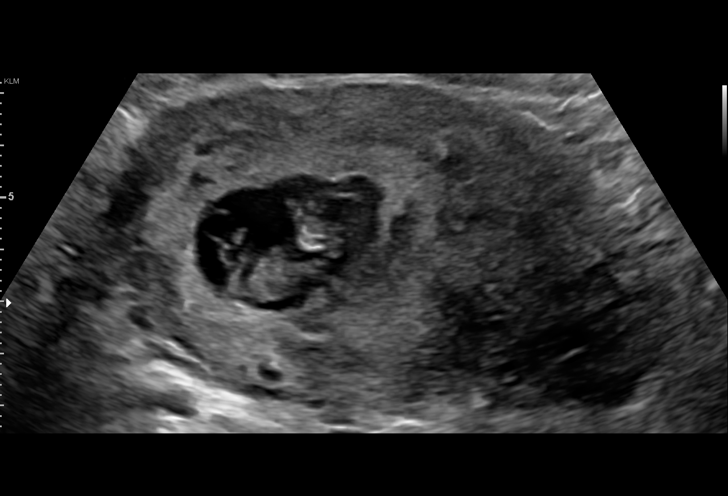
[im 20/32]
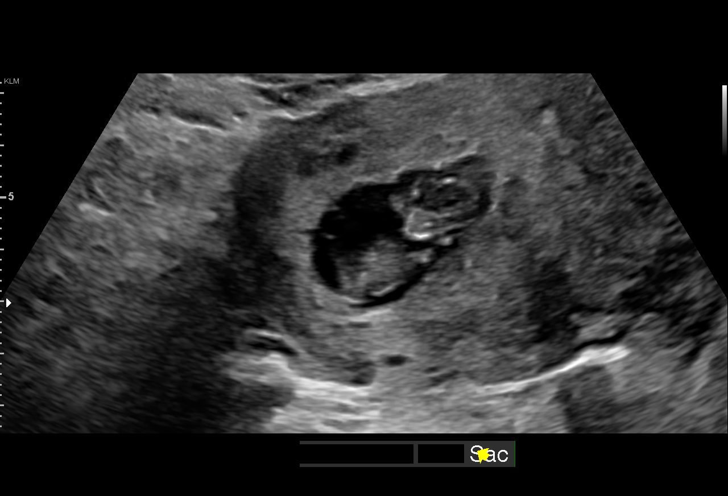
[im 22/32]
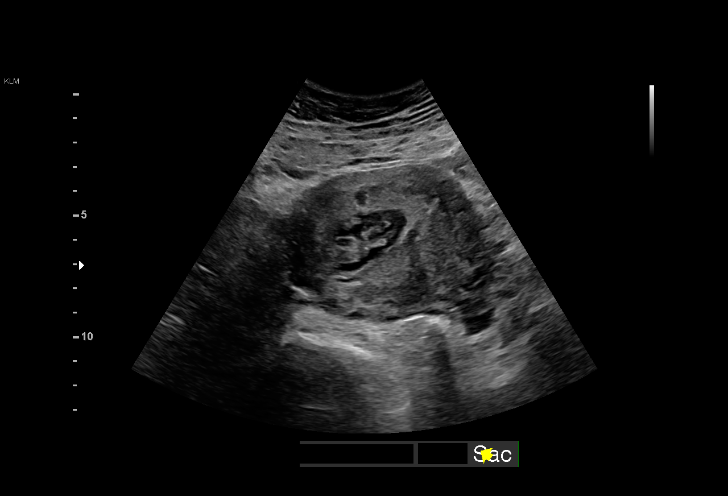
[im 25/32]
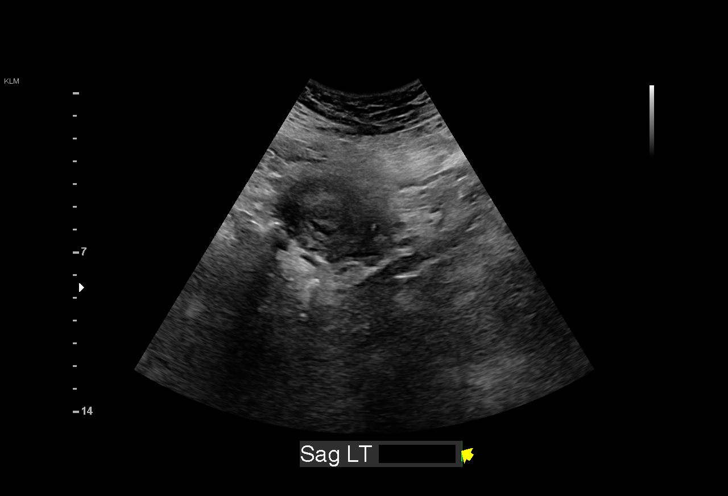
[im 27/32]
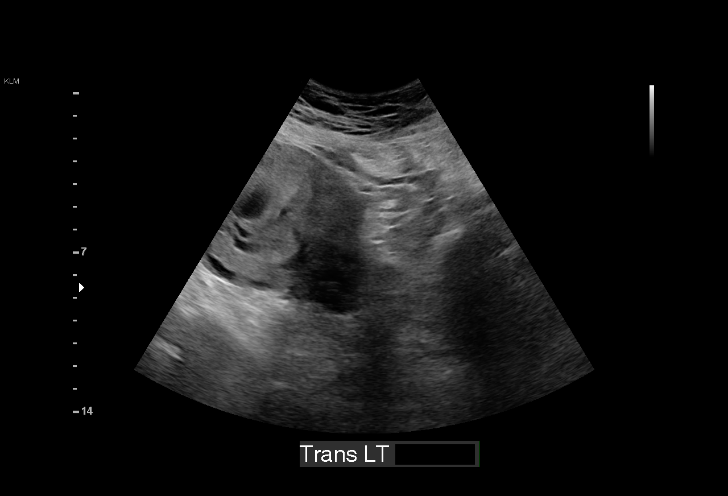
[im 29/32]
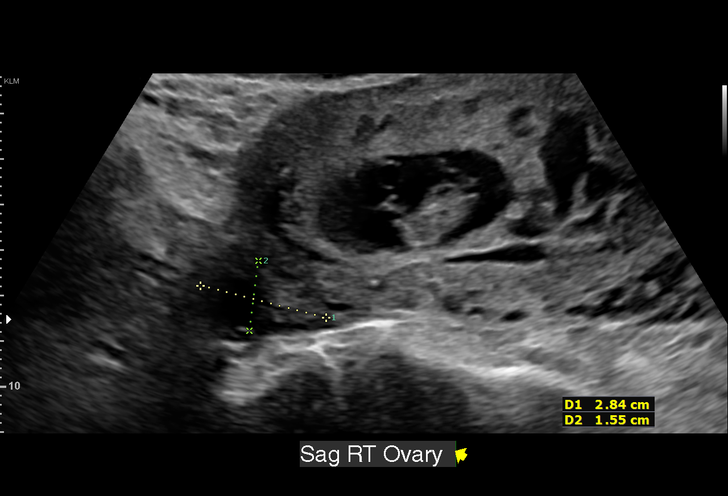
[im 32/32]
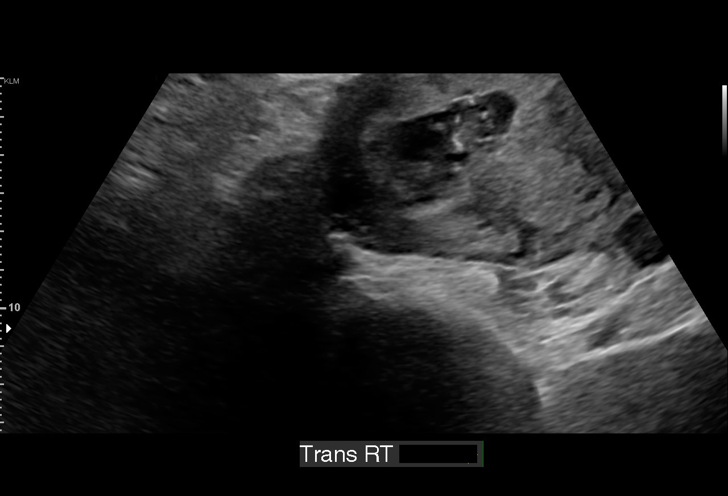

[15 of 28 positions shown; findings below may reference images not displayed]

FINDINGS: Intrauterine gestational sac: Single intrauterine gestational sac.

Yolk sac:  Seen

Embryo:  Present

Cardiac Activity: Detected

Heart Rate: 171 bpm

CRL:   36 mm   10 w 3 d                  US EDC: 09/01/2021.

Subchorionic hemorrhage:  None visualized.

Maternal uterus/adnexae: The maternal ovaries unremarkable.
IMPRESSION: Single live intrauterine pregnancy with an estimated gestational age
of 10 weeks, 3 days behind age based on LMP.

## 2023-04-01 ENCOUNTER — Encounter: Payer: Self-pay | Admitting: *Deleted

## 2023-04-01 ENCOUNTER — Ambulatory Visit
Admission: EM | Admit: 2023-04-01 | Discharge: 2023-04-01 | Disposition: A | Discharge status: Home / Self Care | Payer: Medicaid Other | Attending: Family Medicine | Admitting: Family Medicine

## 2023-04-01 ENCOUNTER — Other Ambulatory Visit: Payer: Self-pay

## 2023-04-01 DIAGNOSIS — H10532 Contact blepharoconjunctivitis, left eye: Secondary | ICD-10-CM

## 2023-04-01 MED ORDER — ERYTHROMYCIN 5 MG/GM OP OINT: TOPICAL_OINTMENT | OPHTHALMIC | 0 refills | Status: AC

## 2023-04-01 NOTE — ED Provider Notes (Signed)
 [facility]    CSN: [id number] Arrival date & time: 04/01/23  1503      History   Chief Complaint Chief Complaint  Patient presents with   Eye Problem    HPI Nancy Fisher is a 33 y.o. female.   The history is provided by a parent.  Eye Problem Location:  Left eye Quality:  Aching Severity:  Moderate Onset quality:  Gradual Timing:  Constant Progression:  Worsening Chronicity:  New Context comment:  Unknown Relieved by: Nothing. Associated symptoms: crusting (Left eye) and swelling (Upper eye lid)   Does not wear contact lenses.  She is unaware of any known contacts with anyone with pinkeye.  She has discolored drainage in the left inner canthus of the eye.  She reports when she awakened she had crusting along the eyelid opening.  Lower eyelid is unaffected also she is now experiencing scleral redness and irritation. Denies any visual acuity changes. 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 Authorizing Provider  erythromycin  ophthalmic ointment Place a 1/2 inch ribbon of ointment into the lower eyelid twice daily for 7 daYS. 04/01/23  Yes Arloa Suzen RAMAN, NP  ibuprofen  (ADVIL ) 600 MG tablet Take 1 tablet (600 mg total) by mouth  every 6 (six) hours. Patient not taking: Reported on 04/01/2023 08/29/21   Gene Lucie BROCKS, CNM  norethindrone  (ORTHO MICRONOR ) 0.35 MG tablet Take 1 tablet (0.35 mg total) by mouth daily. Do not start before September 24, 2021 Patient not taking: Reported on 04/01/2023 08/29/21 08/29/22  Weinhold, Samantha C, CNM  Prenatal Vit-Fe Fumarate-FA (MULTIVITAMIN-PRENATAL) 27-0.8 MG TABS tablet Take 1 tablet by mouth daily at 12 noon. Patient not taking: Reported on 04/01/2023    [provider]    Family History Family History  Problem Relation Age of Onset   Other Neg Hx    Alcohol abuse Neg Hx    Arthritis Neg Hx    Asthma Neg Hx    Birth defects Neg Hx    Cancer Neg Hx    COPD Neg Hx    Depression Neg Hx    Diabetes Neg Hx    Drug abuse Neg Hx    Early death Neg Hx    Hearing loss Neg Hx    Heart disease Neg Hx    Hyperlipidemia Neg Hx    Hypertension Neg Hx    Kidney disease Neg Hx    Learning disabilities Neg  Hx    Mental illness Neg Hx    Mental retardation Neg Hx    Miscarriages / Stillbirths Neg Hx    Stroke Neg Hx    Vision loss Neg Hx     Social History Social History   Tobacco Use   Smoking status: Never   Smokeless tobacco: Never  Vaping Use   Vaping status: Never Used  Substance Use Topics   Alcohol use: No   Drug use: No     Allergies   Amoxicillin and Latex   Review of Systems Review of Systems   Physical Exam Triage Vital Signs ED Triage Vitals  Encounter Vitals Group     BP 04/01/23 1520 112/72     Systolic BP Percentile --      Diastolic BP Percentile --      Pulse Rate 04/01/23 1520 98     Resp 04/01/23 1520 20     Temp 04/01/23 1520 (!) 97.4 F (36.3 C)     Temp Source 04/01/23 1520 Oral     SpO2 04/01/23 1520 97 %     Weight --      Height --      Head Circumference --      Peak Flow --      Pain Score 04/01/23 1533 8     Pain Loc --      Pain Education --      Exclude from Growth Chart --    No data found.  Updated Vital  Signs BP 112/72 (BP Location: Left Arm)   Pulse 98   Temp (!) 97.4 F (36.3 C) (Oral)   Resp 20   LMP 03/18/2023 (Approximate)   SpO2 97%   Breastfeeding No   Visual Acuity Right Eye Distance:   Left Eye Distance:   Bilateral Distance:    Right Eye Near:   Left Eye Near:    Bilateral Near:     Physical Exam Vitals reviewed.  HENT:     Head: Normocephalic and atraumatic.  Eyes:     General:        Left eye: Discharge present.    Conjunctiva/sclera:     Right eye: Right conjunctiva is not injected. No chemosis, exudate or hemorrhage.    Left eye: Left conjunctiva is injected. Hemorrhage present.     Pupils: Pupils are equal, round, and reactive to light.     Comments: Left upper eyelid mild swelling tenderness with palpation.   Cardiovascular:     Rate and Rhythm: Normal rate and regular rhythm.  Pulmonary:     Effort: Pulmonary effort is normal.     Breath sounds: Normal breath sounds.  Musculoskeletal:     Cervical back: Normal range of motion.  Skin:    General: Skin is warm and dry.  Neurological:     General: No focal deficit present.     Mental Status: She is alert and oriented to person, place, and time.      UC Treatments / Results  Labs (all labs ordered are listed, but only abnormal results are displayed) Labs Reviewed - No data to display  EKG   Radiology No results found.  Procedures Procedures (including critical care time)  Medications Ordered in UC Medications - No data to display  Initial Impression / Assessment and Plan / UC Course  I have reviewed the triage vital signs and the nursing notes.  Pertinent labs & imaging results that were available during my care of the patient were  reviewed by me and considered in my medical decision making (see chart for details).    Contact blepharoconjunctivitis left eye , Romycin lower eyelid twice daily for 7 days. Encouraged frequent hand washing to prevent spread of infection. Return  precautions given if symptoms worsen or do not improve. Final Clinical Impressions(s) / UC Diagnoses   Final diagnoses:  Contact blepharoconjunctivitis of left eye   Discharge Instructions   None    ED Prescriptions     Medication Sig Dispense Auth. Provider   erythromycin  ophthalmic ointment Place a 1/2 inch ribbon of ointment into the lower eyelid twice daily for 7 daYS. 3.5 g Arloa Suzen RAMAN, NP      PDMP not reviewed this encounter.   Arloa Suzen RAMAN, NP 04/01/23 [phone]

## 2023-04-01 NOTE — ED Triage Notes (Signed)
 Left eye swollen painful- woke up last night with symptoms
